# Patient Record
Sex: Male | Born: 1940 | Marital: Single | Smoking: Never smoker
Health system: Southern US, Community
[De-identification: ages and names within clinical notes are randomized; demographics above are authoritative.]

## PROBLEM LIST (undated history)

## (undated) DIAGNOSIS — H9190 Unspecified hearing loss, unspecified ear: Secondary | ICD-10-CM

## (undated) DIAGNOSIS — F0390 Unspecified dementia without behavioral disturbance: Secondary | ICD-10-CM

## (undated) DIAGNOSIS — E785 Hyperlipidemia, unspecified: Secondary | ICD-10-CM

## (undated) DIAGNOSIS — K219 Gastro-esophageal reflux disease without esophagitis: Secondary | ICD-10-CM

## (undated) DIAGNOSIS — N179 Acute kidney failure, unspecified: Secondary | ICD-10-CM

## (undated) DIAGNOSIS — E559 Vitamin D deficiency, unspecified: Secondary | ICD-10-CM

## (undated) DIAGNOSIS — I214 Non-ST elevation (NSTEMI) myocardial infarction: Secondary | ICD-10-CM

## (undated) HISTORY — DX: Unspecified hearing loss, unspecified ear: H91.90

## (undated) HISTORY — DX: Acute kidney failure, unspecified: N17.9

## (undated) HISTORY — DX: Gastro-esophageal reflux disease without esophagitis: K21.9

## (undated) HISTORY — DX: Hyperlipidemia, unspecified: E78.5

## (undated) HISTORY — DX: Unspecified dementia, unspecified severity, without behavioral disturbance, psychotic disturbance, mood disturbance, and anxiety: F03.90

## (undated) HISTORY — DX: Vitamin D deficiency, unspecified: E55.9

## (undated) HISTORY — DX: Non-ST elevation (NSTEMI) myocardial infarction: I21.4

---

## 2008-03-04 ENCOUNTER — Emergency Department: Payer: Self-pay | Admitting: Emergency Medicine

## 2010-09-22 ENCOUNTER — Observation Stay: Payer: Self-pay | Admitting: Internal Medicine

## 2010-11-12 ENCOUNTER — Emergency Department: Payer: Self-pay | Admitting: Internal Medicine

## 2011-10-15 ENCOUNTER — Inpatient Hospital Stay: Payer: Self-pay | Admitting: Internal Medicine

## 2011-10-15 LAB — CBC
HCT: 43.8 % (ref 40.0–52.0)
HGB: 14.3 g/dL (ref 13.0–18.0)
MCHC: 32.6 g/dL (ref 32.0–36.0)
MCV: 97 fL (ref 80–100)
RDW: 12.5 % (ref 11.5–14.5)
WBC: 15.2 10*3/uL — ABNORMAL HIGH (ref 3.8–10.6)

## 2011-10-15 LAB — COMPREHENSIVE METABOLIC PANEL
Albumin: 3.9 g/dL (ref 3.4–5.0)
BUN: 8 mg/dL (ref 7–18)
Bilirubin,Total: 0.3 mg/dL (ref 0.2–1.0)
Chloride: 103 mmol/L (ref 98–107)
Creatinine: 1.31 mg/dL — ABNORMAL HIGH (ref 0.60–1.30)
EGFR (African American): >60
Glucose: 139 mg/dL — ABNORMAL HIGH (ref 65–99)
SGOT(AST): 30 U/L (ref 15–37)
SGPT (ALT): 23 U/L
Total Protein: 7.2 g/dL (ref 6.4–8.2)

## 2011-10-15 LAB — URINALYSIS, COMPLETE
Leukocyte Esterase: NEGATIVE
Nitrite: NEGATIVE
Ph: 5 (ref 4.5–8.0)
Protein: NEGATIVE
RBC,UR: 1 /HPF (ref 0–5)

## 2011-10-16 LAB — BASIC METABOLIC PANEL WITH GFR
Anion Gap: 8
BUN: 11 mg/dL
Calcium, Total: 8.5 mg/dL
Chloride: 106 mmol/L
Co2: 26 mmol/L
Creatinine: 1.38 mg/dL — ABNORMAL HIGH
EGFR (African American): >60
EGFR (Non-African Amer.): 52 — ABNORMAL LOW
Glucose: 98 mg/dL
Osmolality: 279
Potassium: 4 mmol/L
Sodium: 140 mmol/L

## 2011-10-16 LAB — CBC WITH DIFFERENTIAL/PLATELET
Basophil #: 0 10*3/uL (ref 0.0–0.1)
Basophil %: 0.2 %
Eosinophil #: 0 10*3/uL (ref 0.0–0.7)
HCT: 35.3 % — ABNORMAL LOW (ref 40.0–52.0)
HGB: 12.2 g/dL — ABNORMAL LOW (ref 13.0–18.0)
Lymphocyte #: 1 10*3/uL (ref 1.0–3.6)
Lymphocyte %: 6.8 %
MCHC: 34.7 g/dL (ref 32.0–36.0)
MCV: 94 fL (ref 80–100)
Monocyte #: 1 x10 3/mm (ref 0.2–1.0)
Monocyte %: 7.2 %
Neutrophil #: 12.4 10*3/uL — ABNORMAL HIGH (ref 1.4–6.5)
RBC: 3.77 10*6/uL — ABNORMAL LOW (ref 4.40–5.90)
RDW: 12.6 % (ref 11.5–14.5)

## 2011-10-17 LAB — BASIC METABOLIC PANEL
Calcium, Total: 8.6 mg/dL (ref 8.5–10.1)
Co2: 25 mmol/L (ref 21–32)
EGFR (African American): 58 — ABNORMAL LOW
EGFR (Non-African Amer.): 50 — ABNORMAL LOW
Potassium: 3.8 mmol/L (ref 3.5–5.1)
Sodium: 142 mmol/L (ref 136–145)

## 2011-10-17 LAB — WBC: WBC: 10.1 10*3/uL (ref 3.8–10.6)

## 2012-06-06 ENCOUNTER — Inpatient Hospital Stay: Payer: Self-pay | Admitting: Internal Medicine

## 2012-06-06 LAB — URINALYSIS, COMPLETE
Glucose,UR: NEGATIVE mg/dL (ref 0–75)
Ketone: NEGATIVE
Leukocyte Esterase: NEGATIVE
Protein: 30
RBC,UR: 1 /HPF (ref 0–5)
Specific Gravity: 1.026 (ref 1.003–1.030)
Squamous Epithelial: NONE SEEN
WBC UR: <1 /HPF (ref 0–5)

## 2012-06-06 LAB — CBC
HGB: 16 g/dL (ref 13.0–18.0)
MCHC: 33.9 g/dL (ref 32.0–36.0)
MCV: 94 fL (ref 80–100)
RBC: 5.03 10*6/uL (ref 4.40–5.90)
RDW: 13 % (ref 11.5–14.5)
WBC: 21.7 10*3/uL — ABNORMAL HIGH (ref 3.8–10.6)

## 2012-06-06 LAB — TROPONIN I
Troponin-I: 0.87 ng/mL — ABNORMAL HIGH
Troponin-I: 0.76 ng/mL — ABNORMAL HIGH

## 2012-06-06 LAB — COMPREHENSIVE METABOLIC PANEL
Albumin: 2.9 g/dL — ABNORMAL LOW (ref 3.4–5.0)
Alkaline Phosphatase: 127 U/L (ref 50–136)
BUN: 35 mg/dL — ABNORMAL HIGH (ref 7–18)
Calcium, Total: 10.1 mg/dL (ref 8.5–10.1)
Chloride: 104 mmol/L (ref 98–107)
Creatinine: 1.54 mg/dL — ABNORMAL HIGH (ref 0.60–1.30)
EGFR (Non-African Amer.): 45 — ABNORMAL LOW
Glucose: 129 mg/dL — ABNORMAL HIGH (ref 65–99)
Osmolality: 285 (ref 275–301)
SGPT (ALT): 17 U/L (ref 12–78)
Sodium: 138 mmol/L (ref 136–145)
Total Protein: 7.7 g/dL (ref 6.4–8.2)

## 2012-06-06 LAB — LIPID PANEL
Cholesterol: 210 mg/dL — ABNORMAL HIGH (ref 0–200)
HDL Cholesterol: 50 mg/dL (ref 40–60)
VLDL Cholesterol, Calc: 35 mg/dL (ref 5–40)

## 2012-06-07 DIAGNOSIS — I214 Non-ST elevation (NSTEMI) myocardial infarction: Secondary | ICD-10-CM

## 2012-06-07 LAB — BASIC METABOLIC PANEL
Anion Gap: 5 — ABNORMAL LOW (ref 7–16)
BUN: 26 mg/dL — ABNORMAL HIGH (ref 7–18)
Calcium, Total: 8.3 mg/dL — ABNORMAL LOW (ref 8.5–10.1)
Co2: 26 mmol/L (ref 21–32)
Creatinine: 1.6 mg/dL — ABNORMAL HIGH (ref 0.60–1.30)
EGFR (Non-African Amer.): 43 — ABNORMAL LOW

## 2012-06-07 LAB — CBC WITH DIFFERENTIAL/PLATELET
Basophil #: 0.1 10*3/uL (ref 0.0–0.1)
Eosinophil %: 0.6 %
HCT: 37.6 % — ABNORMAL LOW (ref 40.0–52.0)
HGB: 12.3 g/dL — ABNORMAL LOW (ref 13.0–18.0)
Lymphocyte #: 1 10*3/uL (ref 1.0–3.6)
MCH: 30.9 pg (ref 26.0–34.0)
MCV: 94 fL (ref 80–100)
Monocyte #: 1 x10 3/mm (ref 0.2–1.0)
Monocyte %: 7.7 %
Neutrophil %: 83.9 %
RDW: 13.2 % (ref 11.5–14.5)

## 2012-06-07 LAB — TROPONIN I: Troponin-I: 0.61 ng/mL — ABNORMAL HIGH

## 2012-06-08 DIAGNOSIS — I359 Nonrheumatic aortic valve disorder, unspecified: Secondary | ICD-10-CM

## 2012-06-08 LAB — BASIC METABOLIC PANEL
BUN: 16 mg/dL (ref 7–18)
Creatinine: 1.2 mg/dL (ref 0.60–1.30)
EGFR (African American): >60
Glucose: 123 mg/dL — ABNORMAL HIGH (ref 65–99)
Potassium: 3.6 mmol/L (ref 3.5–5.1)
Sodium: 141 mmol/L (ref 136–145)

## 2012-06-08 LAB — CBC WITH DIFFERENTIAL/PLATELET
Basophil #: 0 10*3/uL (ref 0.0–0.1)
Basophil %: 0.7 %
Eosinophil #: 0.4 10*3/uL (ref 0.0–0.7)
Eosinophil %: 5.4 %
HGB: 12.4 g/dL — ABNORMAL LOW (ref 13.0–18.0)
Lymphocyte %: 12.3 %
MCV: 93 fL (ref 80–100)
Monocyte #: 0.8 x10 3/mm (ref 0.2–1.0)
Neutrophil %: 70.6 %
RDW: 13.1 % (ref 11.5–14.5)
WBC: 6.9 10*3/uL (ref 3.8–10.6)

## 2012-06-08 LAB — URINE CULTURE

## 2012-06-09 ENCOUNTER — Telehealth: Payer: Self-pay

## 2012-06-09 NOTE — Telephone Encounter (Signed)
TCM  

## 2012-06-09 NOTE — Telephone Encounter (Signed)
Message copied by [facility], MELISSA E on Thu Jun 09, 2012 12:31 PM ------      Message from: Thersa Salt      Created: Thu Jun 09, 2012 12:06 PM      Regarding: TCM       TCM APPT 3/14 WITH ARIDA ------

## 2012-06-09 NOTE — Telephone Encounter (Signed)
TCM #1 attempt 3/7 D/c 3/6

## 2012-06-11 LAB — CULTURE, BLOOD (SINGLE)

## 2012-06-15 ENCOUNTER — Encounter: Payer: Self-pay | Admitting: *Deleted

## 2012-06-17 ENCOUNTER — Encounter: Payer: Self-pay | Admitting: Cardiovascular Disease

## 2012-06-17 ENCOUNTER — Ambulatory Visit (INDEPENDENT_AMBULATORY_CARE_PROVIDER_SITE_OTHER): Payer: Medicare Other | Admitting: Cardiovascular Disease

## 2012-06-17 VITALS — BP 129/83 | HR 90 | Ht 65.5 in | Wt 123.2 lb

## 2012-06-17 DIAGNOSIS — I219 Acute myocardial infarction, unspecified: Secondary | ICD-10-CM

## 2012-06-17 DIAGNOSIS — I214 Non-ST elevation (NSTEMI) myocardial infarction: Secondary | ICD-10-CM

## 2012-06-17 NOTE — Patient Instructions (Addendum)
Start Aspirin 81 mg once daily.  Follow up as needed.

## 2012-06-20 DIAGNOSIS — I214 Non-ST elevation (NSTEMI) myocardial infarction: Secondary | ICD-10-CM | POA: Insufficient documentation

## 2012-06-20 NOTE — Assessment & Plan Note (Signed)
Recent small non-ST elevation myocardial infarction in the setting of pneumonia and inflammatory reaction. Echocardiogram showed normal LV systolic function. Due to his severe dementia,  I recommend medical therapy only and avoiding any ischemic for invasive cardiac evaluation.  Continue treatment with aspirin, small dose metoprolol and a statin.

## 2012-06-20 NOTE — Progress Notes (Signed)
HPI  This is a 72 year old man who is here today for a followup visit. He has known history of severe frontal lobe dementia and has been in a skilled nursing facility over the last 2 years. He presented recently to [facility] with mental status changes. He was found to be slightly hypoxic and tachycardic with a heart rate of 136 beats per minute. Chest x-ray showed right lung infiltrate. He was diagnosed with pneumonia. A He was noted to have an elevated troponin at 0.87. ECG showed minor ST depression in the inferolateral and anterior leads. The patient could not provide any history. Echocardiogram showed hyperdynamic LV systolic function, moderate left ventricular hypertrophy and mild to moderate aortic regurgitation. The patient is not able to provide any history or complaints due to  his baseline severe dementia.  Allergies  Allergen Reactions  . Sulfa Antibiotics     rash     Current Outpatient Prescriptions on File Prior to Visit  Medication Sig Dispense Refill  . ALUMINUM & MAGNESIUM HYDROXIDE PO Take by mouth.      Marland Kitchen atorvastatin (LIPITOR) 10 MG tablet Take 10 mg by mouth daily.      Marland Kitchen azithromycin (ZITHROMAX) 500 MG tablet Take 500 mg by mouth daily.      . cholecalciferol (VITAMIN D) 1000 UNITS tablet Take 1,000 Units by mouth 2 (two) times daily.      . hydrOXYzine (ATARAX/VISTARIL) 25 MG tablet Take 25 mg by mouth daily.      Marland Kitchen ketoconazole (NIZORAL) 2 % cream Apply topically as needed.      . metoprolol tartrate (LOPRESSOR) 25 MG tablet Take 12.5 mg by mouth 2 (two) times daily.      . mirtazapine (REMERON) 15 MG tablet Take 15 mg by mouth at bedtime.      . Multiple Vitamin (MULTIVITAMIN) tablet Take 1 tablet by mouth daily.      . Nutritional Supplements (ENSURE PLUS PO) Take by mouth 3 (three) times daily.      Marland Kitchen omeprazole (PRILOSEC) 40 MG capsule Take 40 mg by mouth daily.      . risperiDONE (RISPERDAL) 0.5 MG tablet Take 0.5 mg by mouth 2 (two) times daily.       No  current facility-administered medications on file prior to visit.     Past Medical History  Diagnosis Date  . Dementia     severe frontal lobe  . GERD (gastroesophageal reflux disease)   . Vitamin D deficiency disease   . Hearing loss   . Hyperlipidemia   . Acute renal failure   . Non-st elevation (nstemi) myocardial infarction      History reviewed. No pertinent past surgical history.   Family History  Problem Relation Age of Onset  . Heart disease Father      History   Social History  . Marital Status: Single    Spouse Name: N/A    Number of Children: N/A  . Years of Education: N/A   Occupational History  . Not on file.   Social History Main Topics  . Smoking status: Never Smoker   . Smokeless tobacco: Not on file  . Alcohol Use: No  . Drug Use: No  . Sexually Active: Not on file   Other Topics Concern  . Not on file   Social History Narrative  . No narrative on file      PHYSICAL EXAM   BP 129/83  Pulse 90  Ht 5' 5.5" (1.664 m)  [employer]  123 lb 4 oz (55.906 kg)  BMI 20.19 kg/m2 Constitutional: He is confused.  No distress.  HENT: No nasal discharge.  Head: Normocephalic and atraumatic.  Eyes: Pupils are equal and round. Right eye exhibits no discharge. Left eye exhibits no discharge.  Neck: Normal range of motion. Neck supple. No JVD present. No thyromegaly present.  Cardiovascular: Normal rate, regular rhythm, normal heart sounds and. Exam reveals no gallop and no friction rub. No murmur heard.  Pulmonary/Chest: Effort normal and breath sounds normal. No stridor. No respiratory distress. He has no wheezes. He has no rales. He exhibits no tenderness.  Abdominal: Soft. Bowel sounds are normal. He exhibits no distension. There is no tenderness. There is no rebound and no guarding.  Musculoskeletal: Normal range of motion. He exhibits no edema and no tenderness.  Neurological: He is alert and oriented to person, place, and time. Coordination normal.    Skin: Skin is warm and dry. No rash noted. He is not diaphoretic. No erythema. No pallor.  Psychiatric: severe baseline dementia.       EKG: Sinus  Rhythm  -Anterior infarct -age undetermined.   -  TW changes consider anterior ischemia  ABNORMAL    ASSESSMENT AND PLAN

## 2012-06-30 ENCOUNTER — Emergency Department: Payer: Self-pay | Admitting: Emergency Medicine

## 2012-06-30 LAB — CBC
HCT: 43.9 % (ref 40.0–52.0)
MCHC: 33.6 g/dL (ref 32.0–36.0)
MCV: 93 fL (ref 80–100)
Platelet: 350 10*3/uL (ref 150–440)
RBC: 4.73 10*6/uL (ref 4.40–5.90)
RDW: 13 % (ref 11.5–14.5)

## 2012-06-30 LAB — COMPREHENSIVE METABOLIC PANEL
Albumin: 3.2 g/dL — ABNORMAL LOW (ref 3.4–5.0)
Anion Gap: 4 — ABNORMAL LOW (ref 7–16)
Chloride: 112 mmol/L — ABNORMAL HIGH (ref 98–107)
Co2: 31 mmol/L (ref 21–32)
Creatinine: 1.32 mg/dL — ABNORMAL HIGH (ref 0.60–1.30)
Glucose: 115 mg/dL — ABNORMAL HIGH (ref 65–99)
Osmolality: 296 (ref 275–301)
Potassium: 4.1 mmol/L (ref 3.5–5.1)
Total Protein: 6.7 g/dL (ref 6.4–8.2)

## 2012-06-30 LAB — URINALYSIS, COMPLETE
Bacteria: NONE SEEN
Bilirubin,UR: NEGATIVE
Blood: NEGATIVE
Glucose,UR: NEGATIVE mg/dL (ref 0–75)
Leukocyte Esterase: NEGATIVE
Nitrite: NEGATIVE
Ph: 6 (ref 4.5–8.0)
Squamous Epithelial: 1

## 2012-06-30 LAB — TROPONIN I: Troponin-I: <0.02 ng/mL

## 2012-07-05 ENCOUNTER — Ambulatory Visit: Payer: Self-pay | Admitting: Internal Medicine

## 2012-07-18 ENCOUNTER — Inpatient Hospital Stay: Payer: Self-pay | Admitting: Internal Medicine

## 2012-07-18 LAB — BASIC METABOLIC PANEL
Anion Gap: 9 (ref 7–16)
Anion Gap: 5 — ABNORMAL LOW (ref 7–16)
BUN: 37 mg/dL — ABNORMAL HIGH (ref 7–18)
BUN: 41 mg/dL — ABNORMAL HIGH (ref 7–18)
BUN: 45 mg/dL — ABNORMAL HIGH (ref 7–18)
Calcium, Total: 8.4 mg/dL — ABNORMAL LOW (ref 8.5–10.1)
Chloride: 122 mmol/L — ABNORMAL HIGH (ref 98–107)
Chloride: 126 mmol/L — ABNORMAL HIGH (ref 98–107)
Co2: 23 mmol/L (ref 21–32)
Co2: 29 mmol/L (ref 21–32)
Creatinine: 1.89 mg/dL — ABNORMAL HIGH (ref 0.60–1.30)
Creatinine: 1.93 mg/dL — ABNORMAL HIGH (ref 0.60–1.30)
EGFR (African American): 40 — ABNORMAL LOW
EGFR (African American): 41 — ABNORMAL LOW
EGFR (African American): 53 — ABNORMAL LOW
EGFR (Non-African Amer.): 34 — ABNORMAL LOW
EGFR (Non-African Amer.): 35 — ABNORMAL LOW
EGFR (Non-African Amer.): 46 — ABNORMAL LOW
Glucose: 105 mg/dL — ABNORMAL HIGH (ref 65–99)
Glucose: 133 mg/dL — ABNORMAL HIGH (ref 65–99)
Osmolality: 322 (ref 275–301)
Osmolality: 323 (ref 275–301)
Potassium: 3.7 mmol/L (ref 3.5–5.1)
Potassium: 3.8 mmol/L (ref 3.5–5.1)
Potassium: 4 mmol/L (ref 3.5–5.1)
Sodium: 154 mmol/L — ABNORMAL HIGH (ref 136–145)
Sodium: 156 mmol/L — ABNORMAL HIGH (ref 136–145)

## 2012-07-18 LAB — URINALYSIS, COMPLETE
Bacteria: NONE SEEN
Bilirubin,UR: NEGATIVE
Blood: NEGATIVE
Glucose,UR: NEGATIVE mg/dL (ref 0–75)
Ketone: NEGATIVE
Leukocyte Esterase: NEGATIVE
Ph: 5 (ref 4.5–8.0)
RBC,UR: 6 /HPF (ref 0–5)
Specific Gravity: 1.033 (ref 1.003–1.030)
Squamous Epithelial: 4
WBC UR: 1 /HPF (ref 0–5)

## 2012-07-18 LAB — CBC
HCT: 49.2 % (ref 40.0–52.0)
HGB: 16.3 g/dL (ref 13.0–18.0)
MCH: 31.3 pg (ref 26.0–34.0)
RBC: 5.2 10*6/uL (ref 4.40–5.90)
RDW: 13.8 % (ref 11.5–14.5)

## 2012-07-18 LAB — TROPONIN I
Troponin-I: <0.02 ng/mL
Troponin-I: <0.02 ng/mL

## 2012-07-18 LAB — CK: CK, Total: 18 U/L — ABNORMAL LOW (ref 35–232)

## 2012-07-19 LAB — BASIC METABOLIC PANEL
Anion Gap: 3 — ABNORMAL LOW (ref 7–16)
BUN: 33 mg/dL — ABNORMAL HIGH (ref 7–18)
Calcium, Total: 8.4 mg/dL — ABNORMAL LOW (ref 8.5–10.1)
Co2: 28 mmol/L (ref 21–32)
Creatinine: 1.41 mg/dL — ABNORMAL HIGH (ref 0.60–1.30)
EGFR (African American): 58 — ABNORMAL LOW
EGFR (Non-African Amer.): 50 — ABNORMAL LOW
Glucose: 114 mg/dL — ABNORMAL HIGH (ref 65–99)
Potassium: 3.5 mmol/L (ref 3.5–5.1)
Sodium: 153 mmol/L — ABNORMAL HIGH (ref 136–145)

## 2012-07-19 LAB — CBC WITH DIFFERENTIAL/PLATELET
Basophil #: 0.1 10*3/uL (ref 0.0–0.1)
Eosinophil #: 0.2 10*3/uL (ref 0.0–0.7)
HCT: 38.7 % — ABNORMAL LOW (ref 40.0–52.0)
Lymphocyte %: 21.1 %
MCHC: 33.8 g/dL (ref 32.0–36.0)
MCV: 95 fL (ref 80–100)
Monocyte #: 0.6 x10 3/mm (ref 0.2–1.0)
Monocyte %: 7.5 %
Neutrophil #: 5.7 10*3/uL (ref 1.4–6.5)
Neutrophil %: 68.2 %
Platelet: 192 10*3/uL (ref 150–440)
WBC: 8.3 10*3/uL (ref 3.8–10.6)

## 2012-07-19 LAB — CK: CK, Total: 14 U/L — ABNORMAL LOW (ref 35–232)

## 2012-07-20 LAB — BASIC METABOLIC PANEL
Anion Gap: 7 (ref 7–16)
BUN: 16 mg/dL (ref 7–18)
Calcium, Total: 8 mg/dL — ABNORMAL LOW (ref 8.5–10.1)
Creatinine: 1.36 mg/dL — ABNORMAL HIGH (ref 0.60–1.30)
Glucose: 80 mg/dL (ref 65–99)
Osmolality: 283 (ref 275–301)
Potassium: 3.3 mmol/L — ABNORMAL LOW (ref 3.5–5.1)
Sodium: 142 mmol/L (ref 136–145)

## 2012-07-21 LAB — BASIC METABOLIC PANEL
Anion Gap: 4 — ABNORMAL LOW (ref 7–16)
BUN: 11 mg/dL (ref 7–18)
Calcium, Total: 7.8 mg/dL — ABNORMAL LOW (ref 8.5–10.1)
Chloride: 112 mmol/L — ABNORMAL HIGH (ref 98–107)
Co2: 28 mmol/L (ref 21–32)
Creatinine: 1.2 mg/dL (ref 0.60–1.30)
EGFR (African American): >60
EGFR (Non-African Amer.): >60
Glucose: 82 mg/dL (ref 65–99)
Osmolality: 285 (ref 275–301)
Potassium: 4.1 mmol/L (ref 3.5–5.1)
Sodium: 144 mmol/L (ref 136–145)

## 2012-08-04 ENCOUNTER — Ambulatory Visit: Payer: Self-pay | Admitting: Internal Medicine

## 2013-04-02 ENCOUNTER — Emergency Department: Payer: Self-pay | Admitting: Emergency Medicine

## 2013-04-02 LAB — URINALYSIS, COMPLETE
Bilirubin,UR: NEGATIVE
Blood: NEGATIVE
Ketone: NEGATIVE
Nitrite: NEGATIVE
Protein: NEGATIVE
Squamous Epithelial: NONE SEEN

## 2013-04-02 LAB — COMPREHENSIVE METABOLIC PANEL
Albumin: 3.6 g/dL (ref 3.4–5.0)
Anion Gap: 8 (ref 7–16)
BUN: 28 mg/dL — ABNORMAL HIGH (ref 7–18)
Bilirubin,Total: 0.3 mg/dL (ref 0.2–1.0)
Calcium, Total: 9.4 mg/dL (ref 8.5–10.1)
Co2: 29 mmol/L (ref 21–32)
Creatinine: 1.49 mg/dL — ABNORMAL HIGH (ref 0.60–1.30)
Potassium: 4.3 mmol/L (ref 3.5–5.1)
SGOT(AST): 9 U/L — ABNORMAL LOW (ref 15–37)
SGPT (ALT): 14 U/L (ref 12–78)
Sodium: 149 mmol/L — ABNORMAL HIGH (ref 136–145)
Total Protein: 7.1 g/dL (ref 6.4–8.2)

## 2013-04-02 LAB — CBC
HGB: 13.8 g/dL (ref 13.0–18.0)
MCH: 31.8 pg (ref 26.0–34.0)
MCHC: 33.6 g/dL (ref 32.0–36.0)
MCV: 95 fL (ref 80–100)
Platelet: 278 10*3/uL (ref 150–440)
RDW: 12.9 % (ref 11.5–14.5)
WBC: 11.1 10*3/uL — ABNORMAL HIGH (ref 3.8–10.6)

## 2013-04-02 LAB — TROPONIN I: Troponin-I: <0.02 ng/mL

## 2013-05-07 DEATH — deceased

## 2014-02-26 IMAGING — CT CT HEAD WITHOUT CONTRAST
2 series · 16 of 30 positions shown, 20 images · non-contrast
Comparison: none

REASON FOR EXAM: Fall, possible head trauma
COMMENTS:

PROCEDURE:     CT  - CT HEAD WITHOUT CONTRAST  - July 17, 2012 [DATE]
RESULT:
Comparison is made to prior study dated 06/30/2012.
TECHNIQUE: Helical noncontrasted 5 mm sections were obtained from the skull
base to the vertex.

[Series 2: without · axial · non-contrast · 0.44mm/px · z∈[-152,-32]mm · 13 of 29 slices shown, 17 images]
[im 3/29  brain]
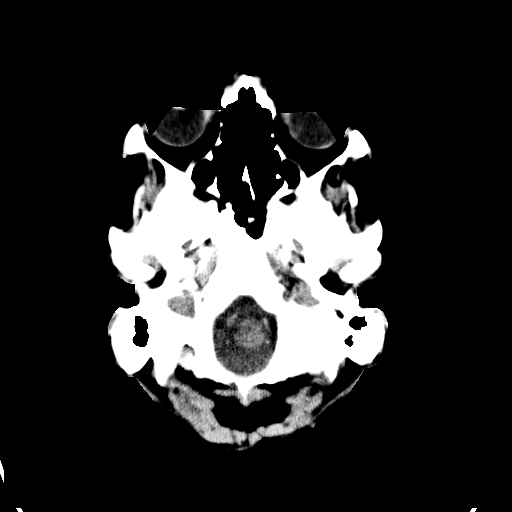
[im 3/29  bone]
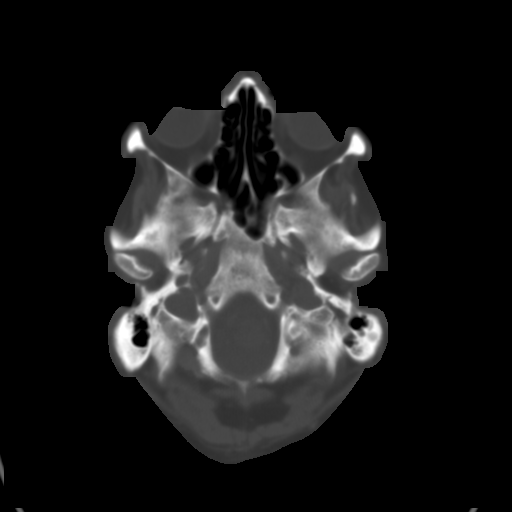
[im 5/29  brain]
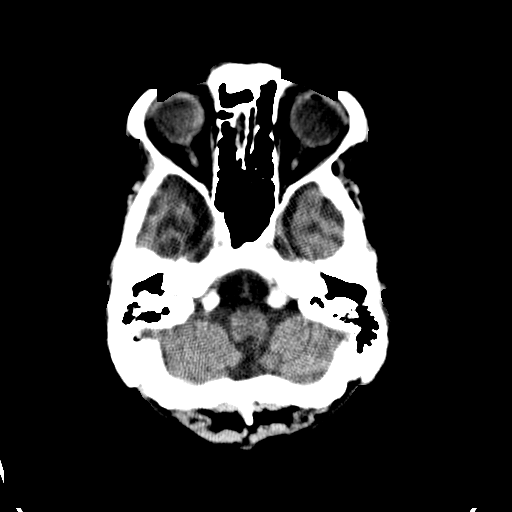
[im 7/29  brain]
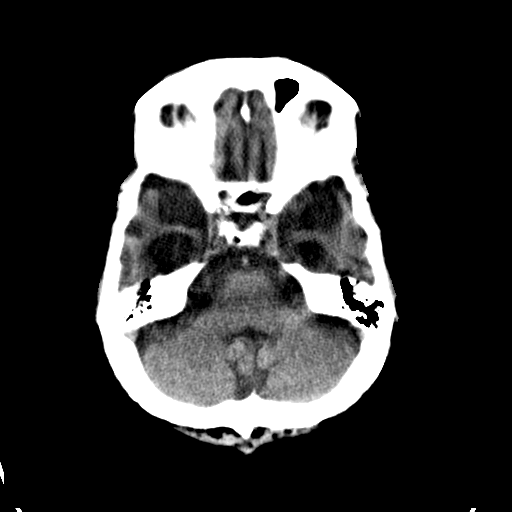
[im 9/29  brain]
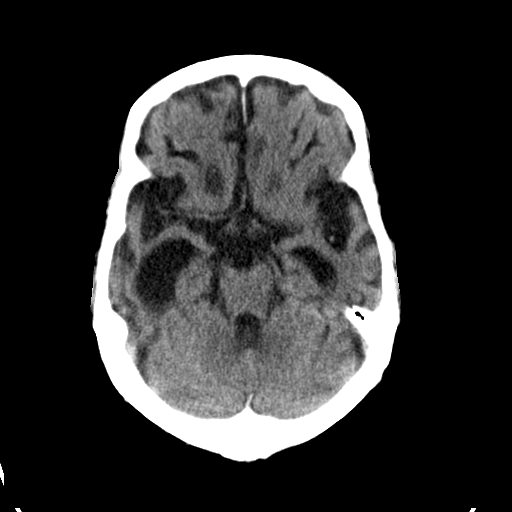
[im 11/29  brain]
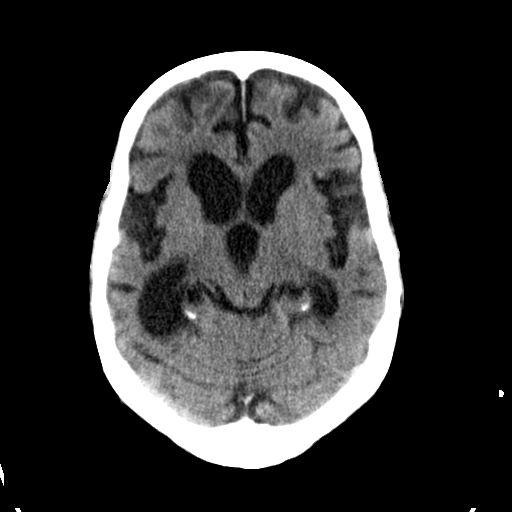
[im 11/29  bone]
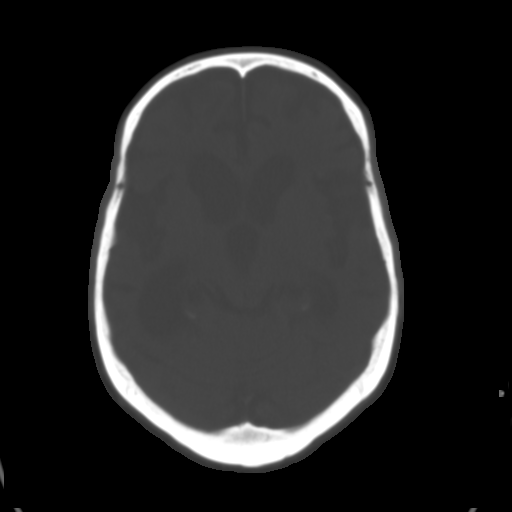
[im 13/29  brain]
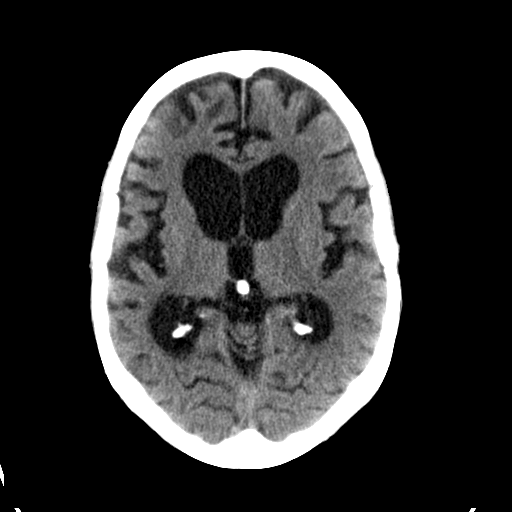
[im 15/29  brain]
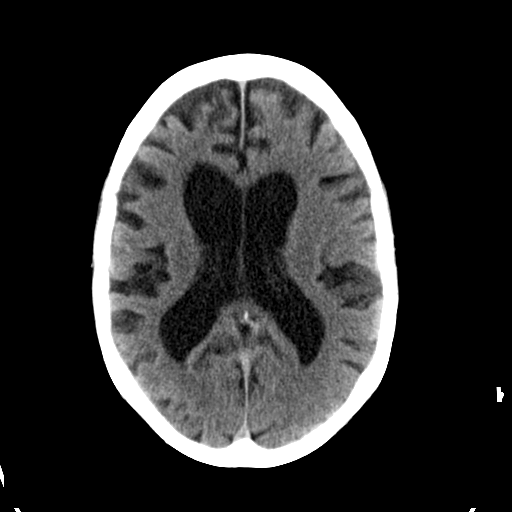
[im 17/29  brain]
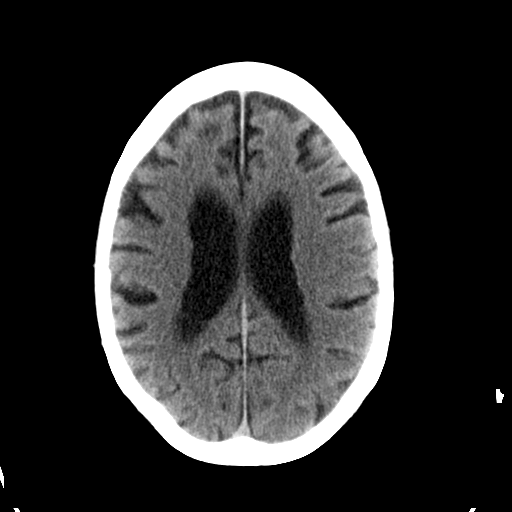
[im 19/29  brain]
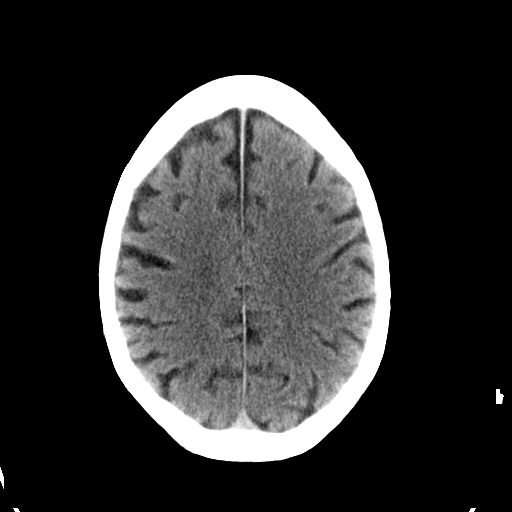
[im 19/29  bone]
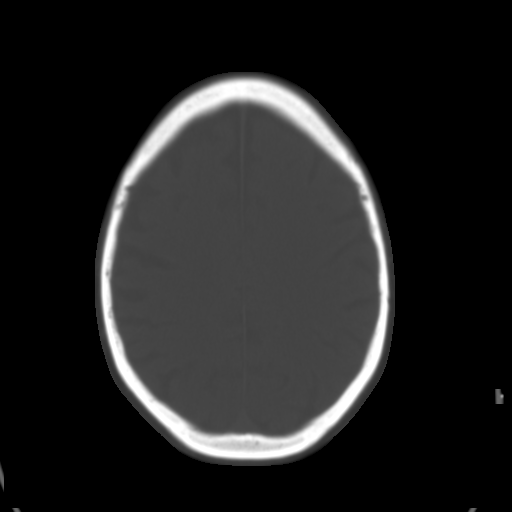
[im 21/29  brain]
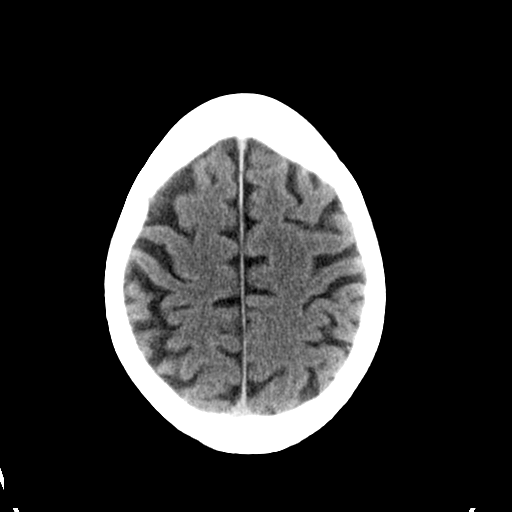
[im 23/29  brain]
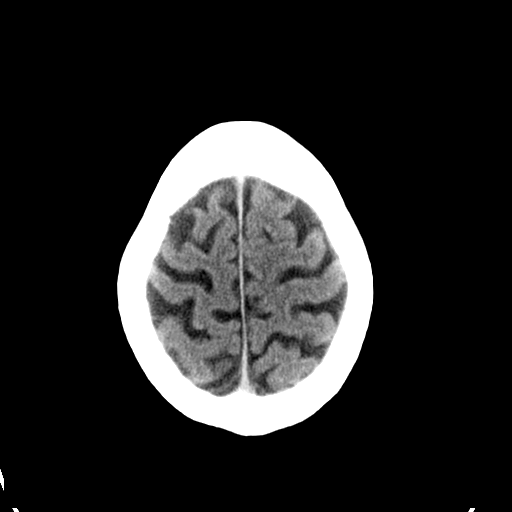
[im 25/29  brain]
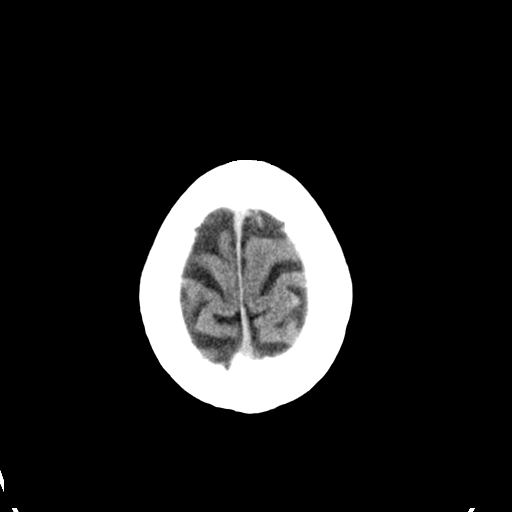
[im 27/29  brain]
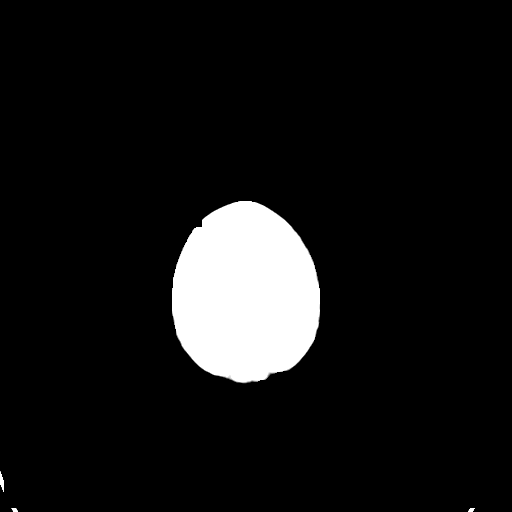
[im 27/29  bone]
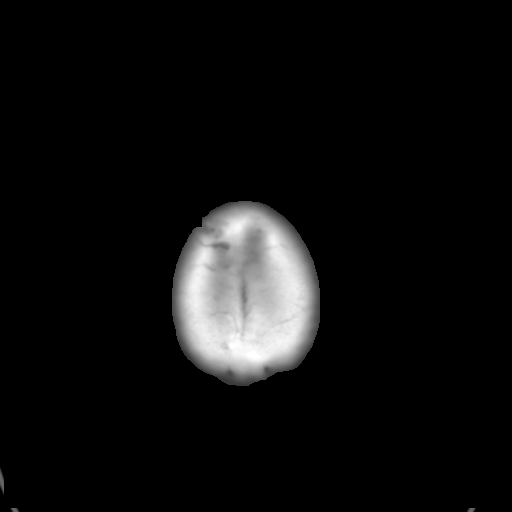

[Series 3: bone · axial · 0.44mm/px · z∈[-152,-112]mm · 3 of 29 slices shown]
[im 3/29  bone]
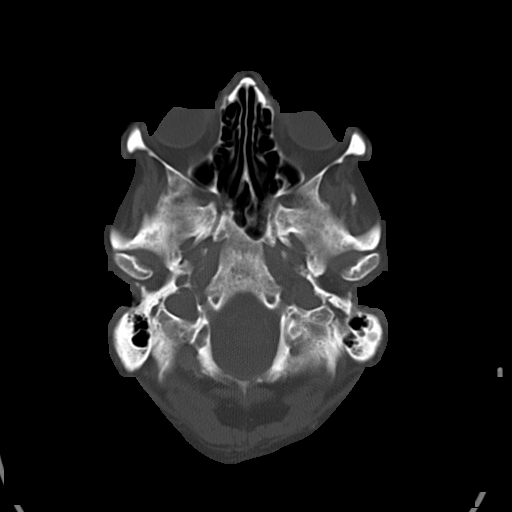
[im 7/29  bone]
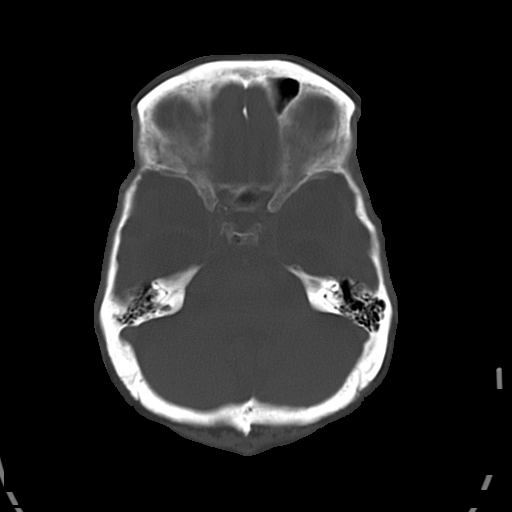
[im 11/29  bone]
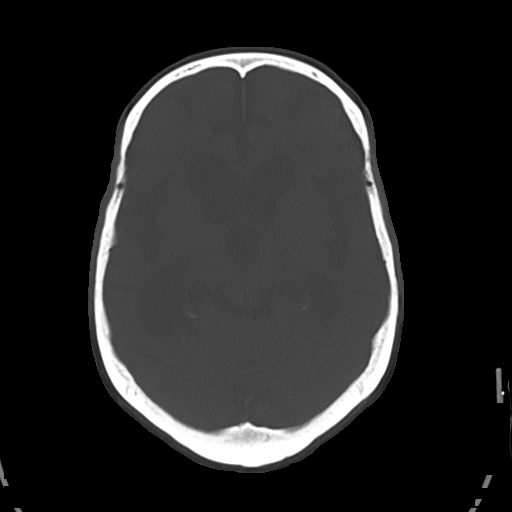

[16 of 30 positions shown; findings below may reference images not displayed]

FINDINGS: 
FINDINGS: A small area of encephalomalacia is identified within the right
frontal lobe. There is no evidence of intracranial hemorrhage, mass effect
or midline shift. There is diffuse cerebral atrophy. The ventricles are
patent. The calvarium is intact. There is no evidence of vascular
calcifications.

There is no evidence of intra-axial or extra-axial fluid collections nor
evidence of acute hemorrhage.
IMPRESSION: 1. Chronic small right frontal lobe infarct.
2. Involutional changes as described above.
3. Dr. Malow of the Emergency Department was informed of these findings via
a preliminary faxed report.

## 2014-03-01 IMAGING — CT CT PELVIS WO/W CM
1 of 2 series · 14 of 32 positions shown, 20 images · non-contrast
Comparison: none

REASON FOR EXAM: possible bladder mass vs debris
COMMENTS:

PROCEDURE:     CT  - CT PELVIS STANDARD W/WO  - July 20, 2012  [DATE]
RESULT:     History: Bladder mass versus debris.
Comparison Study: Renal ultrasound.

[Series 2: wo · axial · 0.67mm/px · z∈[-1057,-829]mm · 14 of 88 slices shown, 20 images]
[im 6/88  soft-tissue]
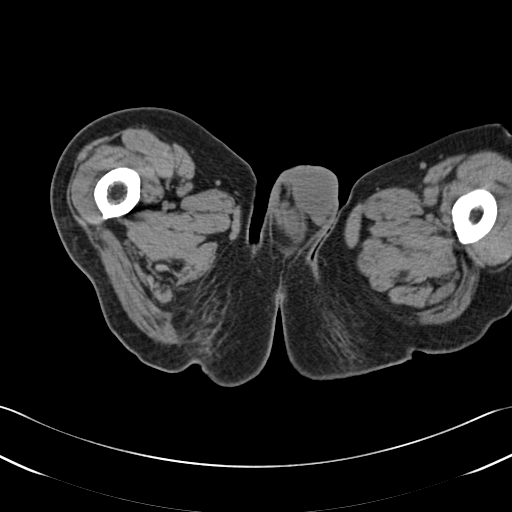
[im 6/88  bone]
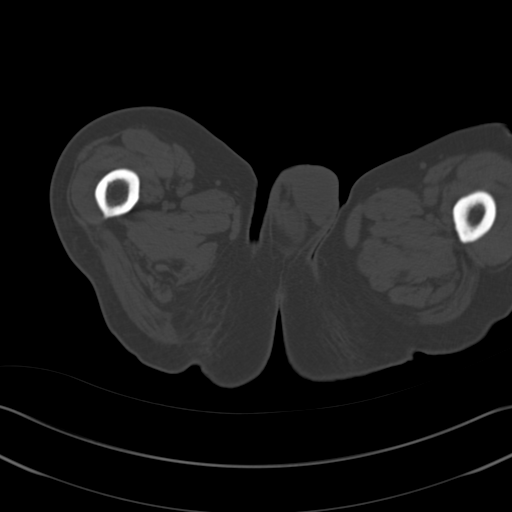
[im 12/88  soft-tissue]
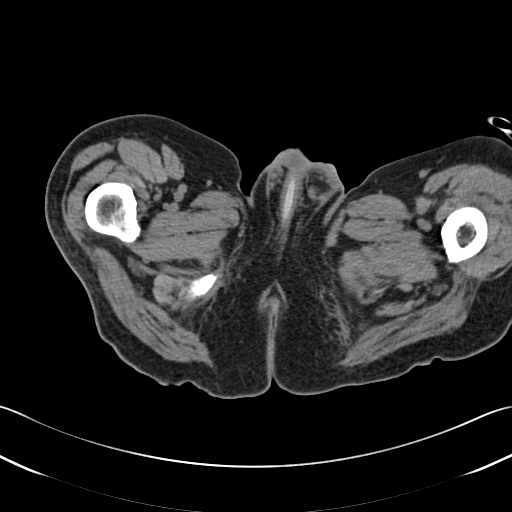
[im 18/88  soft-tissue]
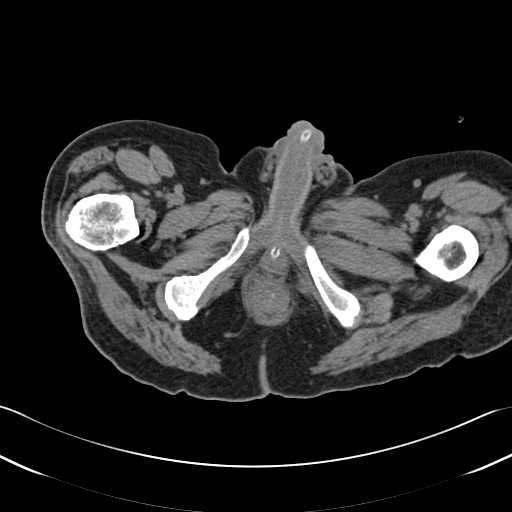
[im 24/88  soft-tissue]
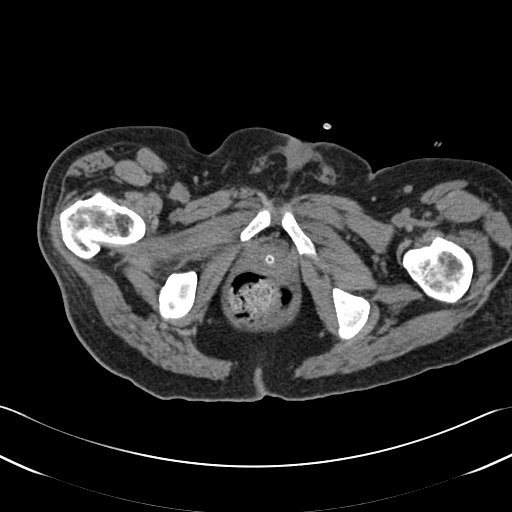
[im 30/88  soft-tissue]
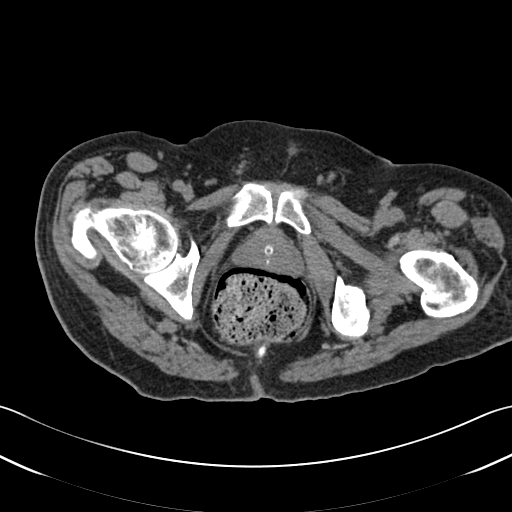
[im 35/88  soft-tissue]
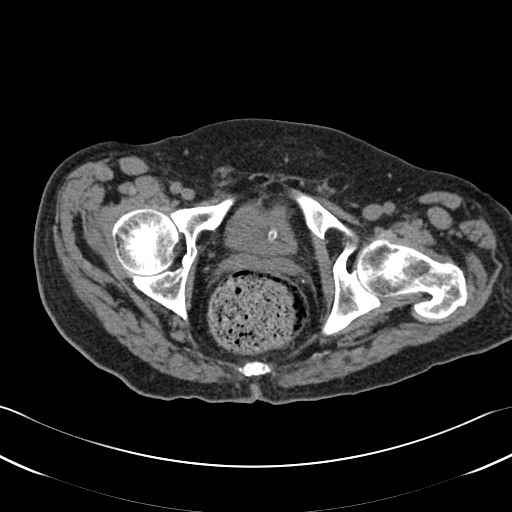
[im 41/88  soft-tissue]
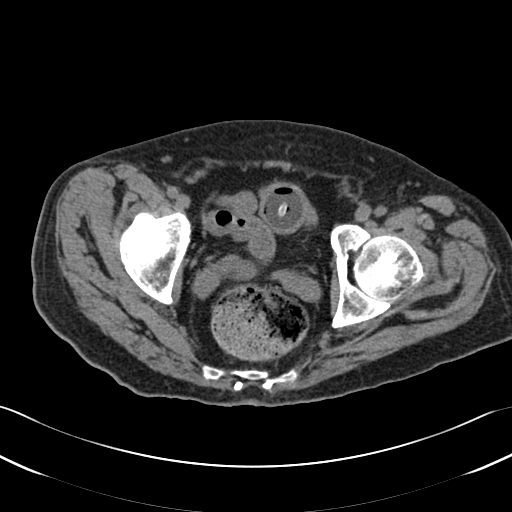
[im 47/88  soft-tissue]
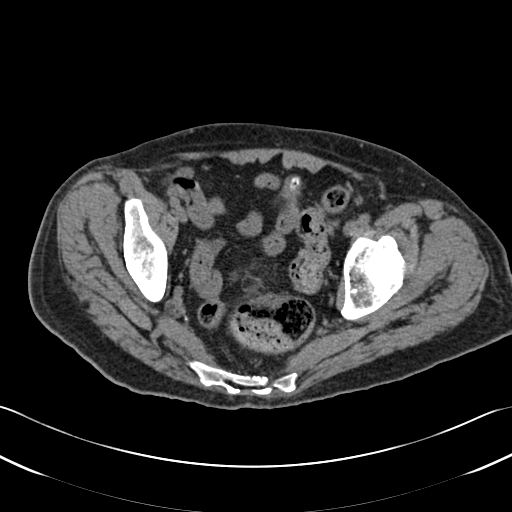
[im 53/88  soft-tissue]
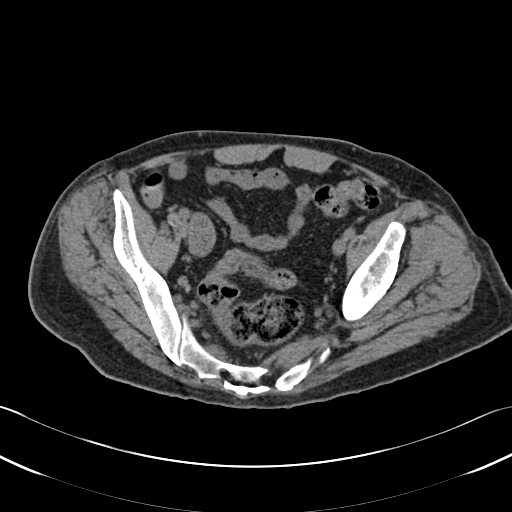
[im 53/88  bone]
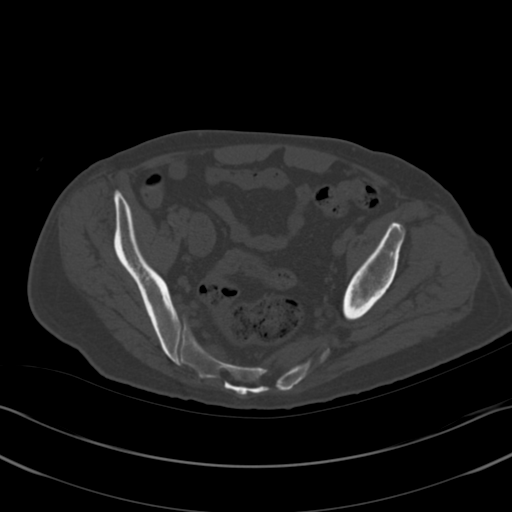
[im 59/88  soft-tissue]
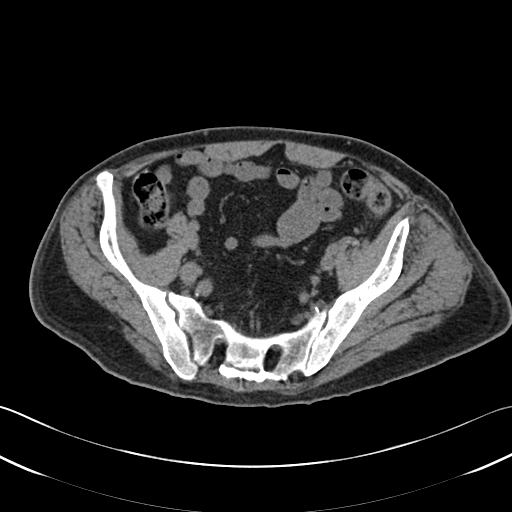
[im 64/88  soft-tissue]
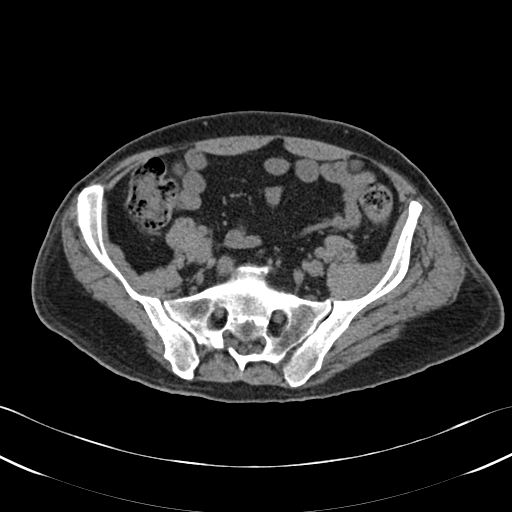
[im 64/88  lung]
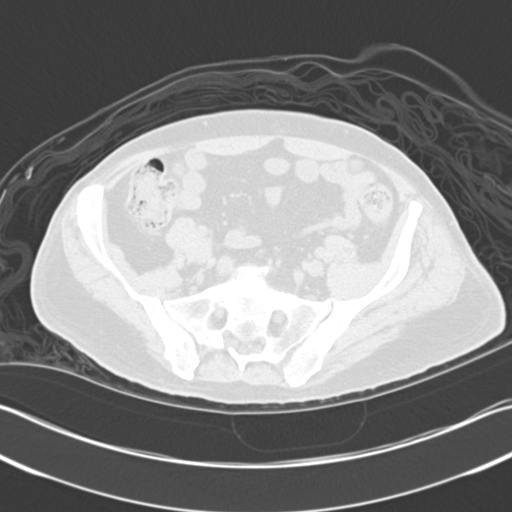
[im 70/88  soft-tissue]
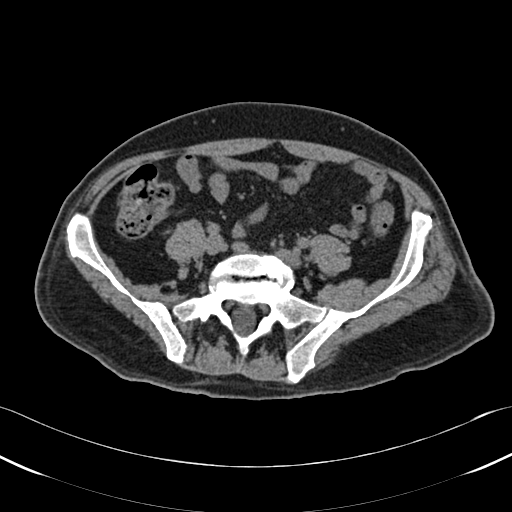
[im 70/88  lung]
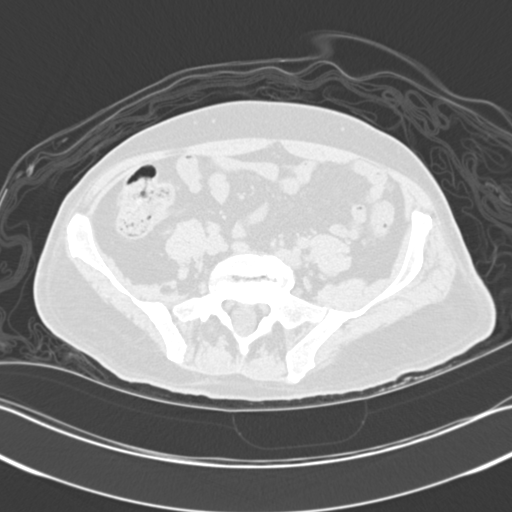
[im 76/88  soft-tissue]
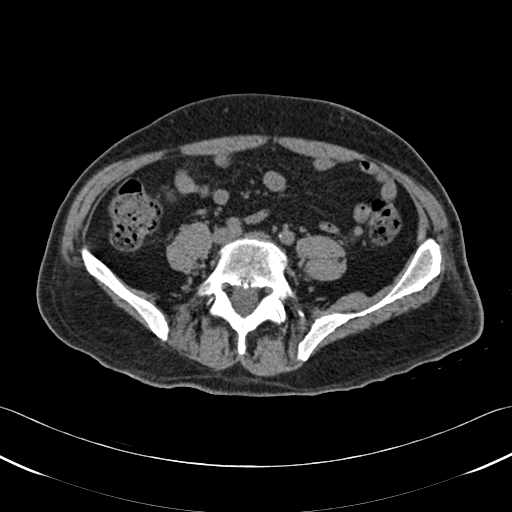
[im 76/88  lung]
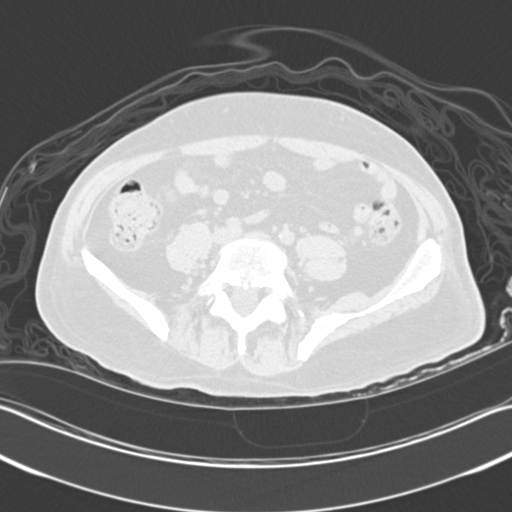
[im 82/88  soft-tissue]
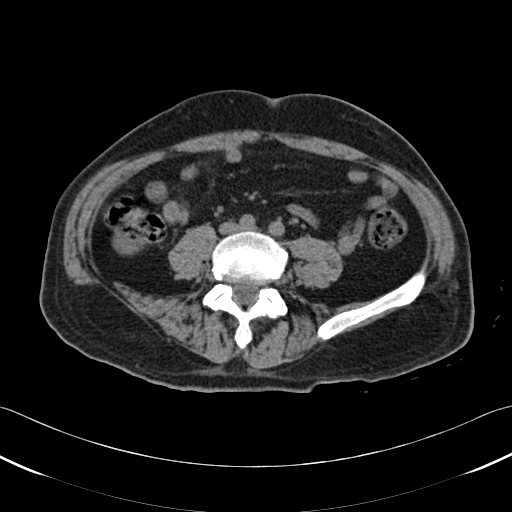
[im 82/88  lung]
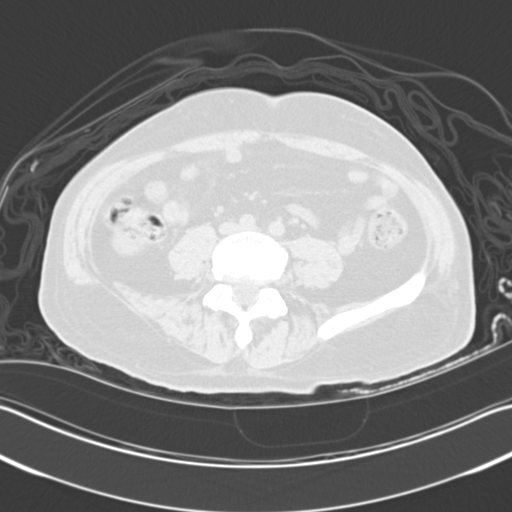

[14 of 32 positions shown; findings below may reference images not displayed]

FINDINGS: Nonenhanced CT of the pelvis was obtained this followed by a CT
cystogram. Foley catheter is present in the bladder. Mild posterior bladder
wall thickening is present. Correlation with cystoscopy is suggested to
exclude a subtle bladder mass. Large amount stool is noted in the
rectosigmoid. No adenopathy noted. No pelvic pathologic fluid collections
are noted. No focal bony abnormality. Degenerative changes lumbar spine
/both hips.
IMPRESSION: 1. Mild thickening of the posterior bladder base. Cystoscopic evaluation is
suggested to exclude a subtle subtle bladder mass.
2. Large amount stool rectosigmoid.

## 2014-07-27 NOTE — Discharge Summary (Signed)
PATIENT NAME:  Nicholas Watts, Nicholas Watts MR#:  [id number] DATE OF BIRTH:  03-Nov-1940  DATE OF ADMISSION:  06/06/2012 DATE OF DISCHARGE:  06/09/2012  DISCHARGE DIAGNOSES:  1. Non-ST elevation myocardial infarction.  2. Pneumonia.  3. Dementia.  4. Acute renal failure, resolved.  PRIMARY CARDIOLOGIST: Muhammad A. Kirke Corin, MD  HISTORY OF PRESENTING ILLNESS: A 74 year old male patient with history of frontal lobe dementia, very severe, and gastroesophageal reflux disease. He has been institutionalized for the past 2 years at an assisted living facility, mostly skilled nursing type of care. Has been doing okay overall until a few days ago. when the patient was noticed being lethargic and uncomfortable, less interactive, not able to sit down and overall looking very sick. Family went to see him and brought him to the Emergency Room. On arrival to ER, his oxygen was 92%, respiratory rate was up to 28 and heart rate was 136, temperature was 96. X-ray showed right lower lobe infiltrate. Also had elevated white cell count. Was admitted for treatment of healthcare-associated pneumonia.   HOSPITAL COURSE AND STAY:  1. He had symptoms of SIRS (systemic inflammatory response syndrome) on presentation to the ER, was secondary to pneumonia, which was treated with Levaquin, Zosyn and azithromycin. Blood culture was negative, and his pneumonia symptoms improved, and he was discharged to continue oral antibiotics at nursing home.  Other medical issues addressed during this hospital stay:  1. Borderline elevated troponin. Troponin was 0.87 on presentation, which came down to 0.76 and 0.61. Cardiology consult was called in for this, and they suggested demand ischemia and suggested to follow echocardiogram, which was followed and without any significant finding of ischemia, and the patient was discharged.  2. Acute renal failure. His creatinine on presentation was 1.6, and so he was given IV fluids. We do not have baseline, so we  thought it might be either acute or acute on chronic, and his creatinine improved later on.  3. Dementia. This was baseline.   LABORATORY RESULTS: Creatinine on presentation 1.54, which came down to 1.20. Troponin 0.87 to 0.61. Total WBC 21,700 and came down to 6900. Blood culture: No growth.  Urinary culture: No growth. Urinalysis: Negative. Global left ventricular systolic function hyperdynamic, impaired relaxation, pattern of left ventricular systolic filling, moderate asymmetrical left ventricular hypertrophy, moderately increased left ventricular septal thickness, mild to moderate aortic regurgitation.   For his chronic congestive heart failure and relaxation abnormalities, I spoke to Dr. Kirke Corin, who suggested no further workup and advised to continue cardiac medication and stop his Lovenox, which he received therapeutic dose for almost 48 hours for non-ST-elevation MI.   CONDITION ON DISCHARGE: Stable.   CODE STATUS: Full code.   MEDICATIONS ON DISCHARGE:  1. Omeprazole 40 mg once a day.  2. Risperdal 0.5 mg oral tablet 2 times a day.  3. Multivitamin once a day.  4. Vitamin D 2 times a day.  5. Atorvastatin 10 mg once a day.  6. Azithromycin 500 mg once a day for 3 days.  7. Metoprolol 25 mg take 1/2-tablet 2 times a day.   HOME HEALTH: Yes, physical therapy and nurse.   HOME OXYGEN: No.   DIET: Low fat, low cholesterol, carbohydrate-controlled diet, regular consistency.   ACTIVITY: As tolerated.   TIMEFRAME TO FOLLOWUP: Within 1 to 2 weeks with Dr. Lorine Bears.  TOTAL TIME SPENT ON THIS DISCHARGE: 45 minutes.    ____________________________ Hope Pigeon Elisabeth Pigeon, MD vgv:OSi D: 06/13/2012 23:45:13 ET T: 06/14/2012 11:10:30 ET JOB#: [id number]  cc:  Hope PigeonVaibhavkumar G. Elisabeth PigeonVachhani, MD, <Dictator> Muhammad A. Kirke CorinArida, MD Altamese DillingVAIBHAVKUMAR VACHHANI MD ELECTRONICALLY SIGNED 07/04/2012 9:25

## 2014-07-27 NOTE — Discharge Summary (Signed)
PATIENT NAME:  Nicholas Watts, Nicholas Watts MR#:  [id number] DATE OF BIRTH:  [birth year]  DATE OF ADMISSION:  07/18/2012 DATE OF DISCHARGE:  07/21/2012   ADMITTING PHYSICIAN:  Ramonita Lab, MD  DISCHARGING PHYSICIAN:  Enid Baas, MD  PRIMARY CARE PHYSICIAN: Dr. Buford Dresser.  CONSULTATIONS IN THE HOSPITAL:   1.  Nephrology consultation by Dr. Mady Haagensen.  2.  Palliative care consultation by Dr. Harriett Sine Phifer.   DISCHARGE DIAGNOSES: 1.  Hypernatremia.  2.  Hypertension secondary to hypovolemia.  3.  Syncope secondary to above.  4.  Acute renal failure.  5.  Advanced frontal lobe dementia.  6.  Failure to thrive.  7.  Bladder wall thickening noted on imaging studies, possible malignancy.  8.  Dysphagia with history of aspiration pneumonia.  DISCHARGE HOME MEDICATIONS: 1.  Omeprazole 40 mg p.o. daily.  2.  Multivitamin 1 tablet p.o. daily.  3.  Ketoconazole ointment 3% once a day as needed.  4.  Ensure Plus 3 times a day.  5.  Mirtazapine 15 mg once a day.  6.  Risperidone 0.5 mg once a day.  7.  Hydroxyzine 25 mg at bedtime as needed for sleeping.   DISCHARGE DIET:  Pureed diet with nectar thick liquids and strict aspiration precautions. Please see the discharge instructions for further recommendations on the diet.  FOLLOWUP INSTRUCTIONS: The patient will be discharged back to assisted living facility with hospice following.  LABORATORY AND DIAGNOSTIC DATA PRIOR TO DISCHARGE:  Sodium is 144, potassium 4.1, chloride 112, bicarbonate 28, BUN 11, creatinine 1.2, glucose 82 and calcium of 7.8.  CT of the pelvis was done without contrast showing mild thickening of the posterior bladder base.  Cystoscopic evaluation adjusted to exclude a bladder mass and large amount of rectosigmoid stool is seen. Ultrasound of the kidneys bilaterally showing no hydronephrosis and focal area of hyperechogenicity in the bladder showing possible bone mass present. Creatinine on admission was 1.93 with sodium  elevated at 156 on admission. Chest x-ray showing mild left basilar opacities secondary to atelectasis and CT of the head showing right frontal lobe infarct and involutional changes.  BRIEF HOSPITAL COURSE:  The patient is a 74 year old Caucasian male with advanced frontal lobe dementia who resides at [facility] ([place]). He was aphasic at baseline with only mumbling and severe dementia who was brought in secondary to a fall and syncopal episode. He was found to be hypotensive and dehydrated on admission also hypernatremic.    1.  Hypovolemic hypotension causing syncope secondary to poor p.o. intake, failure to thrive. He was given IV fluids and that improved his blood pressure.  2.  Hypernatremia, again secondary to poor intake and free water deficit. He was placed on D5 and sodium finally normalized but because of his poor intake, the problem is going to be ongoing and recurrent so palliative care was consulted and the patient had some family.  He says he has  2 ex-wives but son, Nicholas Watts, is the healthcare power of attorney and in discussing with the family, they do not want any PEG tube.  Speech pathologist recommended a puree diet with nectar thick liquids with strict aspiration precautions at this time. Bladder mass was found incidentally on the CT scan, but no further recommendation pursued.  The patient qualified for hospice secondary to advanced dementia, so he will be discharged back to [facility] with hospice following as per the family's wishes.  His medications were minimized and he is also on omeprazole for his reflux  disease. His renal failure also improved by the time of discharge.   CODE STATUS: DO NOT RESUSCITATE.   DISCHARGE CONDITION: Guarded with poor prognosis.   DISCHARGE DISPOSITION: Assisted living facility with hospice following.   Time spent on discharge: 45 minutes.    ____________________________ Enid Baasadhika Aricka Goldberger, MD rk:ct D: 07/21/2012 15:32:20  ET T: 07/21/2012 16:32:29 ET JOB#: [id number]  cc: Enid Baasadhika Danyal Adorno, MD, <Dictator> Enid BaasADHIKA Remee Charley MD ELECTRONICALLY SIGNED 07/22/2012 15:54

## 2014-07-27 NOTE — Consult Note (Signed)
General Aspect 74 yo male with PMHx significant for frontal lobe dementia and GERD who was hospitalized yesterday for SIRS sourced from [facility].   Present Illness He resides at [facility]. History was supplemented by the family. The family visited the patient on Saturday and he was noted to be in his [place]. The following day, he was apparently more lethargic and generally did not look well prompting his ED presentation. Denies chest pain, SOB, PND, LE edema, orthopnea, syncope or palpitations. No prior cardiac history. Never has seen a cardiologist- no stress testing, cath or cardiac surgery in the past. He does have hyperlipidemia. No history of HTN, DM2, tobacco abuse or family history of CAD. He does walk at the facility quite frequently, and has had no limitation with this.   There, CXR revealed a RLL infiltrate and he met SIRS criteria. He was diagnosed with HCAP, started on empiric BS antibiotics and admitted by the medicine service. Cardiac markers did return mildly elevated and downtrended (TnI 0.87=>0.76=>0.61). CBC indicated a leukocytosis at 21.7. H/H stable. BMET- BUN 35/Cr 1.54. Lipid panel indicates LDL 125, TC 210, HDL 50, TG 150. U/a unremarkable. Today, CBC reveals reduced H/H 12.3/37.6. Leukocytosis improved to ~ 13. BUN 26/Cr 1.60.  EKG reveals sinus tachycardia with anterior and inferolateral downsloping/flat ST depressions with associated TWIs (1 mm). He did have a CT for incoordination exhibited on admission. This revealed increased ventriculomegaly c/w cerebral atrophy, normal pressure hydrocephalus could not be excluded.     Dysphagia: baseline at NH per family report   Dementia:        Admit Diagnosis:   HOSPITAL ACQUIRED PNEUMONIA: Onset Date: 07-Jun-2012, Status: Active, Description: HOSPITAL ACQUIRED PNEUMONIA    Sodium Chloride 0.9%, 1000 ml at 999 ml/hr, Stop After: 1 Doses, 06-Jun-2012, Completed, Standard   Sodium Chloride 0.9%, 1000 ml at 999 ml/hr, Stop After: 1  Doses, 06-Jun-2012, Completed, Standard   Sodium Chloride 0.9%, 1000 ml at 75 ml/hr, 06-Jun-2012, Active, Standard   Vancomycin  injection, ( Vancocin injection )  1000 mg in Dextrose 5% 250 ml, IV Piggyback, once, Infuse over 60 minute(s)  Indication: Infection, 06-Jun-2012, Completed, Standard   Aspirin Chewable, 324 mg Oral once  - Indication: Pain/Fever/Thromboembolic Disorders/Post MI/Prophylaxis MI, 06-Jun-2012, Completed, Standard   Piperacillin-Tazobactam injection,  ( Zosyn )  3.375 gram, IV Piggyback, once, Infuse over 30 minute(s)  Indication: Infection, 06-Jun-2012, Completed, Standard   Acetaminophen * tablet, ( Tylenol (325 mg) tablet)  650 mg Oral q4h PRN for pain or temp. greater than 100.4  - Indication: Pain/Fever, 06-Jun-2012, Active, Standard   HePARin injection, 5000 unit(s), Subcutaneous, q12h  Indication: Anticoagulant, Monitor Anticoags per hospital protocol, 06-Jun-2012, Discontinued, Standard   Levofloxacin injection, ( Levaquin injection )  750 mg, IV Piggyback, q24h, Infuse over 90 minute(s)  Indication: Infection, -Then pharmacy to dose, 06-Jun-2012, Discontinued, Standard   Ondansetron injection, ( Zofran injection )  4 mg, IV push, q4h PRN for Nausea/Vomiting  Indication: Nausea/ Vomiting, 06-Jun-2012, Active, Standard   Pantoprazole tablet, 40 mg Oral q6am  - Indication: Erosive Esophagitis/ GERD  Instructions:  DO NOT CRUSH, 06-Jun-2012, Active, Standard   Piperacillin-Tazobactam injection, ( Zosyn )  3.375 gram, IV Piggyback, q8h, Infuse over 4 hour(s)  Indication: Infection  Pharmacy dose per Zosyn Ext Inf Protocol, -Then pharmacy to dose, 06-Jun-2012, Discontinued, Standard   Vancomycin  injection, ( Vancocin injection )  1000 mg in Dextrose 5% 250 ml, IV Piggyback, q24h, Infuse over 60 minute(s)  Indication: Infection, -Then pharmacy  to dose, 06-Jun-2012, Discontinued, Standard   Enoxaparin injection,  ( Lovenox injection )  65 mg,  Subcutaneous, q12h  Indication: Prophylaxis or treatment of thromboembolic disorders, Monitor Anticoags per hospital protocol, 06-Jun-2012, Active, Standard   Aspirin Tri-Buffered, 325 mg Oral daily  - Indication: Pain/Fever/Thromboembolic Disorders/Post MI/Prophylaxis MI, 06-Jun-2012, Active, Standard   atorvaSTATin tablet,  ( Lipitor)  10 mg Oral at bedtime  - Indication: Hypercholesterolemia, 06-Jun-2012, Active, Standard   meTOProlol injection, ( Lopressor injection )  2.5 mg, IV push, q6h  Indication: Antihypertensive/ Angina, hold for sbp < 100 hr <50, 06-Jun-2012, Active, Standard   Nitroglycerin 2% ointment, 0.5 inch(es) Topical bid  -Indication:Angina/ Hypertension  Instructions:  Apply daily at 6 am, 12 noon and remove at 6 pm to begin 12 hour nitrate free period., 06-Jun-2012, Active, Standard   Nitroglycerin Ointment/Patch - REMOVAL, 6pm, 06-Jun-2012, Active, Standard   Cholecalciferol  tablet, ( Vitamin D3)  1000 unit(s) Oral daily  - Indication: Vit D deficiency, 06-Jun-2012, Active, Standard   hydrOXYzine tablet, ( Atarax)  25 mg Oral at bedtime  - Indication: Anxiety/ Puritis/ Nausea/ Withdrawal Symptoms, 06-Jun-2012, Active, Standard   Mirtazapine tablet, ( Remeron)  15 mg Oral at bedtime  - Indication: Depression, 06-Jun-2012, Active, Standard   Multivitamin tablet, ( Vitamin - Multiple)  1 tablet(s) Oral daily  - Indication: Prevention and Treatment of Vitamin Deficiencies, 06-Jun-2012, Active, Standard   risperiDONE tablet, ( RisperDAL)  0.5 mg Oral bid  - Indication: Psychotic Disorders/ Dementia, 06-Jun-2012, Active, Standard   Azithromycin injection, 500 mg in Dextrose 5% 250 ml, IV Piggyback, q24h, Infuse over 60 minute(s)  Indication: Infection, 06-Jun-2012, Active, Standard   Influenza Virus Trivalent Vaccine injection, 0.5 ml, Intramuscular, atdischarge  Indication: Provide Active Immunity to Influenza Strains contained in Vaccine, GIVE ON  DISCHARGE DAY., 06-Jun-2012, Active, Standard   Piperacillin-Tazobactam injection,  ( Zosyn )  3.375 gram, IV Piggyback, q8h, Infuse over 4 hour(s)  Indication: Infection  Pharmacy dose per Zosyn Ext Inf Protocol, <<<Extended infusion protocol>>>, 06-Jun-2012, Active, Standard   Vancomycin  injection, ( Vancocin injection )  750 mg in Dextrose 5% 250 ml, IV Piggyback, q18h, Infuse over 60 minute(s)  Indication: Infection, 07-Jun-2012, Active, Standard   Levofloxacin injection, ( Levaquin injection )  750 mg, IV Piggyback, q48h, Infuse over 90 minute(s)  Indication: Infection, 08-Jun-2012, Pending, Standard   Albuterol/Ipratropium SVN, Initiate Respiratory Therapy Protocol  3 ml SVN q4h while awake  CP will dilute in standard solution (NS 71m), 06-Jun-2012, Active, Standard   Oxygen, PRN  Titrate Adj SAT(%):92%  CP please wean O2 to 92% per oxygen weaning protocol  Initiate Respiratory Therapy Protocol, 06-Jun-2012, Active, Standard  Home Medications: Medication Instructions Status  Vitamin D3 1000 intl units oral tablet 1 tab(s) orally 2 times a day Active  mirtazapine 15 mg oral tablet 1 tab(s) orally once a day (at bedtime) Active  ketoconazole topical 2% topical cream Apply topically to affected area once a day, As Needed Active  omeprazole 40 mg oral delayed release capsule 1 cap(s) orally once a day Active  risperidone 0.5 mg oral tablet 1 tab(s) orally 2 times a day Active  multivitamin 1 tab(s) orally once a day Active  hydrOXYzine hydrochloride 25 mg oral tablet 1 tab(s) orally once a day (at bedtime) Active   Lab Results:  Hepatic:  03-Mar-14 13:38   Bilirubin, Total 0.4  Alkaline Phosphatase 127  SGPT (ALT) 17  SGOT (AST)  45  Total Protein, Serum 7.7  Albumin, Serum  2.9  Routine Micro:  03-Mar-14 13:38   Micro Text Report BLOOD CULTURE   COMMENT                   NO GROWTH IN 18-24 HOURS   ANTIBIOTIC                       Micro Text Report BLOOD CULTURE    COMMENT                   NO GROWTH IN 18-24 HOURS   ANTIBIOTIC                       Specimen Source right arm  Specimen Source left arm  Culture Comment NO GROWTH IN 18-24 HOURS  Result(s) reported on 07 Jun 2012 at 07:21AM.  Culture Comment NO GROWTH IN 18-24 HOURS  Result(s) reported on 07 Jun 2012 at 07:21AM.  Routine Chem:  03-Mar-14 13:38   Result Comment TROPONIN - RESULTS VERIFIED BY REPEAT TESTING.  - CALLED RESULT TO LINDA MCLAMB AT 1424  - 06/06/12-DAC  - READ-BACK PROCESS PERFORMED.  Result(s) reported on 06 Jun 2012 at 02:30PM.  Glucose, Serum  129  BUN  35  Creatinine (comp)  1.54  Sodium, Serum 138  Potassium, Serum 4.3  Chloride, Serum 104  CO2, Serum 24  Calcium (Total), Serum 10.1  Anion Gap 10  Osmolality (calc) 285  eGFR (African American)  52  eGFR (Non-African American)  45 (eGFR values <60m/min/1.73 m2 may be an indication of chronic kidney disease (CKD). Calculated eGFR is useful in patients with stable renal function. The eGFR calculation will not be reliable in acutely ill patients when serum creatinine is changing rapidly. It is not useful in  patients on dialysis. The eGFR calculation may not be applicable to patients at the low and high extremes of body sizes, pregnant women, and vegetarians.)  Cholesterol, Serum  210  Triglycerides, Serum 175  HDL (INHOUSE) 50  VLDL Cholesterol Calculated 35  LDL Cholesterol Calculated  125 (Result(s) reported on 06 Jun 2012 at 07:45PM.)    21:40   Result Comment troponin - RESULTS VERIFIED BY REPEAT TESTING.  - previously called _0 :24 06-06-12 by DFrancisco Result(s) reported on 06 Jun 2012 at 10:29PM.  04-Mar-14 05:44   Result Comment TROPONIN - RESULTS VERIFIED BY REPEAT TESTING.  - PREV. C/ 06-06-12 _1  BY DAC..Marland KitchenJO  Result(s) reported on 07 Jun 2012 at 06:58AM.  Glucose, Serum 87  BUN  26  Creatinine (comp)  1.60  Sodium, Serum 140  Potassium, Serum 4.0  Chloride, Serum  109  CO2, Serum 26   Calcium (Total), Serum  8.3  Anion Gap  5  Osmolality (calc) 284  eGFR (African American)  50  eGFR (Non-African American)  43 (eGFR values <622mmin/1.73 m2 may be an indication of chronic kidney disease (CKD). Calculated eGFR is useful in patients with stable renal function. The eGFR calculation will not be reliable in acutely ill patients when serum creatinine is changing rapidly. It is not useful in  patients on dialysis. The eGFR calculation may not be applicable to patients at the low and high extremes of body sizes, pregnant women, and vegetarians.)  Magnesium, Serum 1.8 (1.8-2.4 THERAPEUTIC RANGE: 4-7 mg/dL TOXIC: > 10 mg/dL  -----------------------)  Cardiac:  03-Mar-14 13:38   Troponin I  0.87 (0.00-0.05 0.05 ng/mL or less: NEGATIVE  Repeat testing in 3-6 hrs  if clinically  indicated. >0.05 ng/mL: POTENTIAL  MYOCARDIAL INJURY. Repeat  testing in 3-6 hrs if  clinically indicated. NOTE: An increase or decrease  of 30% or more on serial  testing suggests a  clinically important change)  CK, Total  741  CPK-MB, Serum  4.9 (Result(s) reported on 06 Jun 2012 at 06:09PM.)    21:40   Troponin I  0.76 (0.00-0.05 0.05 ng/mL or less: NEGATIVE  Repeat testing in 3-6 hrs  if clinically indicated. >0.05 ng/mL: POTENTIAL  MYOCARDIAL INJURY. Repeat  testing in 3-6 hrs if  clinically indicated. NOTE: An increase or decrease  of 30% or more on serial  testing suggests a  clinically important change)  04-Mar-14 05:44   Troponin I  0.61 (0.00-0.05 0.05 ng/mL or less: NEGATIVE  Repeat testing in 3-6 hrs  if clinically indicated. >0.05 ng/mL: POTENTIAL  MYOCARDIAL INJURY. Repeat  testing in 3-6 hrs if  clinically indicated. NOTE: An increase or decrease  of 30% or more on serial  testing suggests a  clinically important change)  Routine UA:  03-Mar-14 14:38   Color (UA) Yellow  Clarity (UA) Turbid  Glucose (UA) Negative  Bilirubin (UA) Negative  Ketones (UA)  Negative  Specific Gravity (UA) 1.026  Blood (UA) Negative  pH (UA) 5.0  Protein (UA) 30 mg/dL  Nitrite (UA) Negative  Leukocyte Esterase (UA) Negative (Result(s) reported on 06 Jun 2012 at 04:52PM.)  RBC (UA) 1 /HPF  WBC (UA) <1 /HPF  Bacteria (UA) NONE SEEN  Epithelial Cells (UA) NONE SEEN  Mucous (UA) PRESENT  Calcium Oxalate Crystal (UA) PRESENT (Result(s) reported on 06 Jun 2012 at 04:52PM.)  Routine Hem:  03-Mar-14 13:38   WBC (CBC)  21.7  RBC (CBC) 5.03  Hemoglobin (CBC) 16.0  Hematocrit (CBC) 47.1  Platelet Count (CBC) 262 (Result(s) reported on 06 Jun 2012 at 01:56PM.)  MCV 94  MCH 31.7  MCHC 33.9  RDW 13.0  04-Mar-14 05:44   WBC (CBC)  13.3  RBC (CBC)  3.99  Hemoglobin (CBC)  12.3  Hematocrit (CBC)  37.6  Platelet Count (CBC) 231  MCV 94  MCH 30.9  MCHC 32.8  RDW 13.2  Neutrophil % 83.9  Lymphocyte % 7.3  Monocyte % 7.7  Eosinophil % 0.6  Basophil % 0.5  Neutrophil #  11.1  Lymphocyte # 1.0  Monocyte # 1.0  Eosinophil # 0.1  Basophil # 0.1 (Result(s) reported on 07 Jun 2012 at 06:33AM.)   EKG:  Interpretation sinus tachycardia, 119 bpm, incomplete RBBB, LAE, ST depression V3-V6, I, II, III, aVF   Radiology Results: XRay:    03-Mar-14 14:01, Chest PA and Lateral  Chest PA and Lateral   REASON FOR EXAM:    WEAKNESS  COMMENTS:       PROCEDURE: DXR - DXR CHEST PA (OR AP) AND LATERAL  - Jun 06 2012  2:01PM     RESULT: Comparison is made to the previous examination of 17 October 2011.    Is a band of density in the right lung base which isunchanged. Fibrosis   or chronic atelectasis or most likely differential considerations.   Infiltrate is felt to be somewhat less likely but not excluded. The heart   size appears normal. There is no edema or pneumothorax.    IMPRESSION:   1. Band ofatelectasis/fibrosis or possibly infiltrate in the right lung   base. Followup to document clearing is recommended.  Dictation Site: 2        Verified By: Cay Schillings  H. Vicie Mutters, M.D., MD  CT:    03-Mar-14 13:58, CT Head Without Contrast  CT Head Without Contrast   REASON FOR EXAM:    LEANING TO THE RIGHT, ? DECREASED MENTAL STATUS, HX   OF DEMENTIA AT BASELINE  COMMENTS:       PROCEDURE: CT  - CT HEAD WITHOUT CONTRAST  - Jun 06 2012  1:58PM     RESULT: Comparison:  None    Technique: Multiple axial images fromthe foramen magnum to the vertex   were obtained without IV contrast.    Findings:      There is no evidence of mass effect, midline shift, or extra-axial fluid   collections.  There is no evidence of a space-occupying lesion or     intracranial hemorrhage. There is no evidence of a cortical-based area of   acute infarction. There is generalized cerebral atrophy. There is   periventricular white matter low attenuation likely secondary to   microangiopathy.    There is ventriculomegaly which is more prominent compared with the prior   examination of 09/22/2010 with a bifrontal diameter of 5.2 cm. The basal   cisterns are patent.    Visualized portions of the orbits are unremarkable. The visualized   portions of the paranasal sinuses and mastoidair cells are unremarkable.     The osseous structures are unremarkable.    IMPRESSION:    No acute intracranial process.    Ventriculomegaly which has increased compared with 09/22/2010 which may be   secondary to cerebral cortical atrophy, but normal pressure hydrocephalus   cannot be excluded. Correlate with clinical exam.    Dictation Site: 1        Verified By: Jennette Banker, M.D., MD    Sulfa drugs: Unknown  Vital Signs/Nurse's Notes: **Vital Signs.:   04-Mar-14 00:32  Vital Signs Type Routine  Temperature Temperature (F) 99.3  Celsius 37.3  Pulse Pulse 104  Respirations Respirations 24  Systolic BP Systolic BP 440  Diastolic BP (mmHg) Diastolic BP (mmHg) 77  Mean BP 90  Pulse Ox % Pulse Ox % 94  Pulse Ox Activity Level  At rest  Oxygen Delivery Room Air/ 21 %    04:11   Vital Signs Type Routine  Temperature Temperature (F) 99  Celsius 37.2  Temperature Source axillary  Pulse Pulse 107  Respirations Respirations 20  Systolic BP Systolic BP 347  Diastolic BP (mmHg) Diastolic BP (mmHg) 64  Mean BP 76  Pulse Ox % Pulse Ox % 94  Pulse Ox Activity Level  At rest  Oxygen Delivery Room Air/ 21 %    09:33  Vital Signs Type Routine  Temperature Temperature (F) 97.9  Celsius 36.6  Temperature Source axillary  Pulse Pulse 87  Respirations Respirations 18  Systolic BP Systolic BP 425  Diastolic BP (mmHg) Diastolic BP (mmHg) 74  Mean BP 95  Pulse Ox % Pulse Ox % 95  Pulse Ox Activity Level  At rest  Oxygen Delivery Room Air/ 21 %    Impression 74 yo male with PMHx significant for frontal lobe dementia and GERD who was hospitalized yesterday for SIRS sourced from HCAP. Cardiac biomarkers returned elevated, EKG shows ST depressions ruling him in for NSTEMI.   Plan 1. SIRS 2. HCAP 3. NSTEMI 4. Prerenal azotemia 5. Normocytic anemia 6. Dementia 7. GERD 8. Hyperlipidemia  The patient has no prior cardiac history and minimal risk factors- age, gender and HLD. Per his family, he has not complained of  chest pain, dyspnea on exertion or shortness of breath. Objectively, cardiac biomarkers and EKG do indicate underlying ischemia, however this may be demand-driven in the setting of SIRS from HCAP. Family seems apprehensive in pursuing cardiac work-up- noninvasive or otherwise. He does seem to have a small degree of JVD on exam, but no peripheral edema or adventitious lung sounds. Would suggest pursuing a 2D echo to evaluate overall cardiac function. If reduced EF or WMA apparent, discussed need to pursue ischemic eval and family understands. Add BB to slow rate and reduce demand. LDL is elevated this admission, add statin.   Electronic Signatures for Addendum Section:  Kathlyn Sacramento (MD) (Signed Addendum 04-Mar-14 18:18)  I suspect that this is type 2 NSTEMI  due to supply demand ischemia. Treat underlying condition. Will use Metoprolol for tachycardia. Can stop full dose Lovenox. Check an echo. Given his significant dementia, an overall conservaitve management is recommended. Ischemic evaluation is not recommended at this time.   Electronic Signatures: Meriel Pica (PA-C)  (Signed 04-Mar-14 14:15)  Authored: General Aspect/Present Illness, Past Medical History, Health Issues, Orders, Home Medications, Labs, EKG , Radiology, Allergies, Vital Signs/Nurse's Notes, Impression/Plan Kathlyn Sacramento (MD)  (Signed 04-Mar-14 18:18)  Co-Signer: Home Medications, Allergies   Last Updated: 04-Mar-14 18:18 by Kathlyn Sacramento (MD)

## 2014-07-27 NOTE — H&P (Signed)
PATIENT NAME:  Nicholas Watts, Nicholas Watts MR#:  [id number] DATE OF BIRTH:  Feb 18, 1941  DATE OF ADMISSION:  07/17/2012  PRIMARY CARE PHYSICIAN: [name].   HISTORY OF PRESENT ILLNESS: The patient is a 74 year old Caucasian male who resides at [facility] nursing [facility]. Was found today lying on the floor secondary to unwitnessed fall. The patient also has not been eating well or drinking well for the past 2 or 3 days. In the ER, the patient is orthostatic with a blood pressure of 82/59 and on standing up, the blood pressure dropped down to 62/49. The patient seems extremely dry. The patient's BUN and creatinine are elevated. The blood pressure being low, the patient was given IV fluid bolus in the ER. No family members are at bedside. The patient is completely demented and nonverbal.   PAST MEDICAL HISTORY: GERD, dysphagia and dementia.   PAST SURGICAL HISTORY: None.   ALLERGIES: SULFA.   HOME MEDICATIONS: Vitamin D3 1000 units p.o. b.i.d., risperidone 0.5 mg 1 tablet 2 times a day, Remeron 15 mg 1 tablet once a day, omeprazole 40 mg once daily, multivitamin 1 tablet p.o. once daily, mirtazapine 15 mg once daily, metoprolol 25 mg 1/2 tablet p.o. b.i.d., ketoconazole topical 2% applied to the affected area, hydroxyzine 1 tablet p.o. once daily, Ensure Plus 227 mL p.o. q.8 hours.   PSYCHOSOCIAL HISTORY: The patient does not smoke, does not drink or take illicit drugs as per the previous reports. The patient is currently residing at [facility] assisted living facility. The patient is chronically demented.   REVIEW OF SYSTEMS: Unobtainable from the patient as the patient is demented.   PHYSICAL EXAMINATION:  VITAL SIGNS: Temp is 98.4. Pulse was eventually 89 after fluid bolus. Respirations 18. Blood pressure is 67/54, 90/80, then 79/62. Pulse ox 94% on room air.  GENERAL APPEARANCE: Not in any acute distress, moderately built and moderately nourished.  HEENT: Normocephalic. Pupils are equal, reacting to  light and accommodation. No scleral icterus. Extraocular movements are intact. No sinus tenderness. Extremely dry mucous membranes. No pharyngeal exudates. No postnasal drip.  NECK: Supple. No JVD. No thyromegaly. No lymphadenopathy.   LUNGS: Clear to auscultation bilaterally. Good air entry. No wheezing. No crackles. No accessory muscle usage. No anterior chest wall tenderness on palpation.  CARDIAC: S1, S2 normal. Regular  rate and rhythm. No murmurs.  GASTROINTESTINAL: Soft. Bowel sounds are positive in all 4 quadrants. Nontender, nondistended. No masses felt.  NEUROLOGIC: Awake and alert but disoriented, with extreme global dementia, not following verbal commands. Reflexes are 2+. Motor and sensory are grossly intact. I could not examine cranial nerves as the patient is with altered mentation.  EXTREMITIES: No edema. No cyanosis. No clubbing.  MUSCULOSKELETAL: No joint effusion, tenderness or erythema.  PSYCHIATRIC: Mood and affect cannot be determined as the patient is extremely demented.   LABS AND IMAGING STUDIES: Glucose 114, BUN 43, creatinine 1.93, sodium 156, potassium 4.0. Chloride level is 122. Serum CO2 is 29. GFR is 34. Anion gap is 5. Serum osmolality 322. Calcium is 72.  LFTs are within normal range except albumin which is low at 3.2. The patient's troponin was elevated at 0.76 and 0.61 on March 3rd and 4th. Urinalysis amber color, hazy in appearance. Glucose, bili and ketones are negative. Specific gravity 1.033. Blood negative, nitrite negative, leukocyte esterase negative. CHEST X-RAY, PORTABLE:  Cardiomegaly and some right lung base atelectasis or fibrosis. Followup with PA and lateral views is recommended on as needed basis. CT HEAD: No  acute findings as per the ER physician. CHEST X-RAY: No acute findings.   ASSESSMENT AND PLAN: A 74 year old male who is residing at [facility] with severe dementia and presenting to the ER after he sustained a fall. Initial CT head is negative. Will  admit the patient for further workup.  1. Syncope: Probably from dehydration. The patient is orthostatic. CT head is negative. The patient was given intravenous fluid boluses in the ER. We will continue intravenous fluids with D5 half-normal with 20 K. Repeat orthostatics. The patient will be monitored on telemetry. Will cycle cardiac biomarkers.  2. As the patient is orthostatic, syncope was presumed to be from orthostatic hypotension. Will monitor the patient on telemetry and cycle cardiac biomarkers. Gentle hydration with intravenous fluids and recheck orthostatics in a.m.  3. Hypernatremia: Probably from severe dehydration. We are providing him with fluid boluses and then will continue fluids at D5 half-normal with 20 K at 100 mL an hour.  4. Acute renal failure: Probably from severe dehydration with dry mucous membranes.  5. Acute kidney injury from severe dehydration. All cell lines are concentrated. Hemoglobin and hematocrit are also concentrated. Avoid nephrotoxins.  6. History of gastroesophageal reflux disease. Will provide Protonix.  7. Dysphagia which is chronic in nature. Will continue close monitoring and will provide the patient with intravenous Protonix.  8. The patient will be provided with gastrointestinal and deep vein thrombosis prophylaxis.   He is FULL CODE.   The diagnosis and plan of care was discussed with the patient.   TOTAL TIME SPENT ON ADMISSION: 50 minutes.    ____________________________ Ramonita LabAruna Jahkari Maclin, MD ag:gb D: 07/18/2012 03:22:48 ET T: 07/18/2012 03:53:25 ET JOB#: [id number]  cc: Ramonita LabAruna Silveria Botz, MD, <Dictator> Ramonita LabARUNA Levelle Edelen MD ELECTRONICALLY SIGNED 07/26/2012 22:49

## 2014-07-27 NOTE — H&P (Signed)
PATIENT NAME:  Nicholas Watts, Nicholas Watts MR#:  [id number] DATE OF BIRTH:  [birth year]  DATE OF ADMISSION:  06/06/2012  REASON FOR ADMISSION:  "Less interactive, not able to sit down, being very weak and lethargic."   PRIMARY CARE PHYSICIAN:  Einar Grad, MD  REFERRING PHYSICIAN:  Randon Goldsmith. Lord, MD  HISTORY OF PRESENT ILLNESS:  The patient is a nice 74 year old old demented patient who has history of frontal lobe dementia, very severe, and gastroesophageal reflux. He has been institutionalized for the last 2 years at an assisted living facility, mostly skilled nursing type of care, and the patient has been doing okay overall up until Saturday. The family is here with the patient and they are able to give me information. Apparently, they saw the patient on Friday and he was doing fine. He was his normal self, but on Saturday, he had different visitors and they stated that was being lethargic, uncomfortable, less interactive, not able to sit down and overall looking sick. The family went to see him today and they determined that he really did not look all right for which they brought him to the ER. At the ER, his oxygen saturation was borderline low around 92%. His respiratory rate was up to 28 and his heart rate was 136. His temperature was low at 96. He had an x-ray that showed a forming right lower lobe infiltrate. May be band atelectasis, fibrosis or possible infiltrate of the right lung is what was described. The patient had elevation of his white blood count for which he is admitted for treatment of healthcare-acquired pneumonia.   REVIEW OF SYSTEMS:  Unable to obtain given the patient's severe dementia. As per family, he has not had any fever. He has not been coughing significantly and overall he was just less interactive and lethargic.   PAST MEDICAL HISTORY:   1.  Severe frontal lobe dementia.  2.  Gastroesophageal reflux.   ALLERGIES:  THE PATIENT IS ALLERGIC TO SULFA THAT CAUSES HIM TO HAVE A RASH.    SOCIAL HISTORY:  The patient does not smoke, does not drink and never has. He used to work for the [employer] for over 42 years. He currently resides at the [facility] assisted living facility.   FAMILY HISTORY:  Positive for lung cancer in his mother and vascular disease and cardiovascular disease in his father.   PAST SURGICAL HISTORY:  The family denies any surgical history.   CURRENT MEDICATIONS:  Prilosec 40 mg once daily, risperidone 0.5 mg twice daily, vitamin D 1000 units twice daily, Ensure 3 times daily, hydroxyzine 25 mg at bedtime, Remeron 15 mg at bedtime, multivitamins once a day and ketoconazole cream on affected areas.   PHYSICAL EXAMINATION: VITAL SIGNS: Blood pressure of 126/71, pulse 136, respirations 28, temperature 96, oxygen saturation around 92% on room air at admission.  GENERAL: The patient is awake. He is nonverbal. He laughs and looks at his family, but that is the most interaction I can get out of him. He is not toxic looking. He is hemodynamically stable.  HEENT: His pupils are equal and reactive. Extraocular movements are intact. Mucosa is very dry. Anicteric sclerae. Pink conjunctivae. No oral lesions. No thrush.  NECK: Supple. No JVD. No thyromegaly. No adenopathy. No carotid bruits. No rigidity.  CARDIOVASCULAR: Regular rate and rhythm. No murmurs, rubs, or gallops. The patient is tachycardic. No displacement of PMI. No tenderness to palpation of anterior chest wall.  LUNGS: Positive rales in right base. The rest of the exam  is clear. No use of accessory muscles. No dullness to percussion.  ABDOMEN: Soft, nontender, nondistended. No hepatosplenomegaly. No masses. Bowel sounds are positive.  GENITAL: The patient has a Foley catheter which is in place. No external lesions.  EXTREMITIES: No edema, no cyanosis, no clubbing. Pulses +2. Capillary refill about 4 seconds.  SKIN: Without any rashes or petechiae. No ulcers.  NEUROLOGIC: Cranial nerves grossly intact. The  patient is demented and not interactive verbally.  PSYCHIATRIC: The patient is not agitated. He seems calm right now.  LYMPHATIC: Negative for lymphadenopathy in neck or supraclavicular areas.  MUSCULOSKELETAL: No significant joint effusions or joint lesions.   DIAGNOSTIC DATA:  Creatinine is 1.54 which has increased from baseline of around 1.3, glucose is 139, BUN 35, albumin 2.9. AST is 45. Total CK is 7.41. CK-MB is 4.9. Troponin is 0.87. White count is 21,000, hemoglobin 16, platelets are 264. Urinalysis shows no signs of urinary infection. Urine seems concentrated and turbid. We are going to send for culture. EKG shows new T-wave inversions on lower leads, but no ST depression. Possible inversions as well on V4 to V6 compared with previous EKG.   ASSESSMENT AND PLAN:  A 74 year old gentleman with dementia admitted for treatment of systemic inflammatory response syndrome, pneumonia and possible non-ST elevation myocardial infarction.  1.  Systemic inflammatory response syndrome. The patient has tachycardia, tachypnea, increased white blood cells, decreased temperature and changes in his mental status. The patient looks as well dehydrated. Likely the source is pulmonary as the patient has a forming infiltrate on the right lower lobe. We are going to treat him for healthcare-acquired pneumonia with Levaquin, Zosyn and vancomycin. I will say stop vancomycin if the patient is not febrile and his white count is coming down without problems, but for now we are going to do vanc for mrsa coverage.. The patient is going to have respiratory toilet and intravenous fluids at a slow rate as his blood pressure is okay.  2.  Pneumonia. The patient has healthcare-acquired pneumonia with treatment with antibiotics mentioned above and pulmonary toilet. At this moment, he is hemodynamically stable.  3.  Non-ST elevation myocardial infarction. The patient has elevated cardiac enzymes, MB-CK and troponin. He does not have  any history of coronary artery disease. His last troponin checked here in the hospital was normal for which it is not necessarily a leak. It could be demand ischemia related to the infection and the tachycardia, but for now what we are going to do is treat it as a non-ST elevation myocardial infarction. Lovenox 1 mg/kg q. 12 hours, statin, beta-blocker and aspirin have been added. The patient is not complaining of any pain. Although he is demented, we do not know. We are going to add on nitro paste and make sure that he does not have low blood pressure.  4.  Dementia. Continue treatment with mirtazapine (Remeron).  5.  Diet. The patient has not concernrs as far as aspiration, but he is on a special diet. We are going to continue pureed diet on the patient with Ensure.  6.  Gastrointestinal prophylaxis with proton pump inhibitor.  7.  Increased creatinine. At this moment, he does not meet criteria for acute kidney injury, but he is definitely azotemic with increased creatinine and BUN. We are going to give him intravenous fluids and follow up on his creatinine.  8.  Other medical problems are stable.  9.  The patient is a full code. This was discussed with the family.  TIME SPENT:  I spent about 45 minutes with this patient.    ____________________________ Felipa Furnaceoberto Sanchez Gutierrez, MD rsg:si D: 06/06/2012 18:36:27 ET T: 06/06/2012 21:39:03 ET JOB#: [id number]  cc: Felipa Furnaceoberto Sanchez Gutierrez, MD, <Dictator> [name], MD Pearletha FurlOBERTO SANCHEZ GUTIERRE MD ELECTRONICALLY SIGNED 06/21/2012 13:02

## 2014-07-27 NOTE — Consult Note (Signed)
PATIENT NAME:  Nicholas Watts, Author MR#:  [id number] DATE OF BIRTH:  10/10/1940  NEPHROLOGY CONSULTATION REPORT  DATE OF CONSULTATION:  07/18/2012  REQUESTING PHYSICIAN:  Dr. Ramonita LabAruna Gouru.  CONSULTING PHYSICIAN:  Blaire Palomino Lizabeth LeydenN. Hudsen Fei, MD  REASON FOR CONSULTATION:   Hypernatremia; acute renal failure.  HISTORY OF PRESENT ILLNESS: The patient is a 74 year old Caucasian male with a past medical history of frontal lobe dementia, GERD, dysphagia, recent pneumonia, recent acute renal failure who presented to [facility] yesterday, status post unwitnessed fall. The patient unfortunately cannot offer any history as he is nonverbal. As per medical records and dictated in chart review, it appears that the patient had not been eating or drinking very well for 2 to 3 days prior to admission. He was found to be significantly hypotensive in the Emergency Room with a systolic blood pressure in the 80s. The patient was found to have significantly dry oral mucosa. We are now consulted for the evaluation and  management of acute renal failure as well as hypernatremia. The patient's serum sodium was 156 upon presentation, and is now 158. He is receiving D5W at present. He also appears to have acute renal failure. His baseline creatinine is 1.2 from 06/08/2012. Creatinine is now up to 1.89. Nursing reports that he did drink some fluid. No swallow evaluation is currently pending.   PAST MEDICAL HISTORY: 1.  Frontal lobe dementia.  2.  Gastroesophageal reflux disease.  3.  Dysphagia.  4.  History of recent pneumonia.  5.  Recent acute renal failure.   PAST SURGICAL HISTORY: None.   ALLERGIES: TO SULFA.   CURRENT INPATIENT MEDICATIONS: Include heparin 5000 units subQ every 8 hours, Zofran 4 mg IV q. 6 hours p.r.n., pantoprazole 40 mg p.o. q.a.m., D5W at 100 mL per hour.   SOCIAL HISTORY: Unable to obtain directly from the patient, however he is a resident at [facility]. He is chronically demented. No  reported tobacco, alcohol, or illicit drug use.   FAMILY HISTORY: Unable to obtain from the patient as he has chronic dementia.   REVIEW OF SYSTEMS: Unable to obtain from the patient as he is nonverbal and chronically demented.   PHYSICAL EXAMINATION: VITAL SIGNS: Temperature 97.6, pulse 78, respirations 20, blood pressure 110/67, pulse oximetry 97%.  GENERAL: Reveals a slender Caucasian male who appears reported age, currently in no acute distress.  HEENT: Normocephalic, atraumatic. Extraocular movements were spontaneously noted. Difficult to assess his hearing. Oral mucosa was quite dry.  NECK: Supple, without JVD or lymphadenopathy.  LUNGS: Clear to auscultation bilaterally, with normal respiratory effort.  CARDIOVASCULAR: S1, S2, regular rate and rhythm. No murmurs, rubs, or gallops appreciated.  ABDOMEN: Soft, nontender, nondistended. Bowel sounds positive. No rebound or guarding. No gross organomegaly appreciated.  EXTREMITIES: No clubbing, cyanosis, or edema.  NEUROLOGIC: The patient is awake, but he is not following commands. He is spontaneously moving his upper and lower extremities.  SKIN: Warm and dry. No rashes noted.  MUSCULOSKELETAL: No joint redness, swelling or tenderness appreciated.  GENITOURINARY: No suprapubic tenderness is noted at this time.  PSYCHIATRIC: Unable to assess at this time.   LABORATORY DATA: Sodium 158, potassium 3.8, chloride 126, CO2 23, BUN 41, creatinine 1.8, glucose 105, CK of 18.   Urinalysis is negative for protein, 6 RBCs per high-power field and 1 WBC per high-power field.   CBC showed a WBC of 12.4, hemoglobin 16.3, hematocrit 49, platelets 239.   Chest x-ray shows minimal left basilar opacities.   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   IMPRESSION: This is a 74 year old Caucasian male with a past medical history of frontal lobe dementia, dysphagia, gastroesophageal reflux disease, recent  pneumonia, recent acute renal failure who presented to [facility] status post fall and found to have acute renal failure, hypernatremia.   PROBLEM LIST: 1.  Severe hypernatremia, with serum sodium of 158.  2.  Acute renal failure.  3.  Chronic dementia.  4.  Hypotension, improved.   PLAN: More than likely the patient's hypernatremia can be attributed to poor p.o. liquid intake over the past several days prior to admission. His acute renal failure may represent acute tubular necrosis, particularly given the fact that he was hypotensive. I agree with administering the patient D5W. We will increase D5W to 150 mL per hour. In regards to his acute renal failure, we will obtain a renal ultrasound to exclude obstruction as a cause of his renal failure. As above, we will continue hydration for the acute renal failure as well. The patient's blood pressure now is currently improved at 110/67. Would certainly continue to monitor a blood pressure curve over the course of this hospitalization.   Further plan to be based upon patient's progress over the course of the hospitalization.   I would like to thank Dr. Amado Coe for this kind referral.     ____________________________ Lennox Pippins, MD mnl:dm D: 07/18/2012 12:25:56 ET T: 07/18/2012 13:25:35 ET JOB#: [id number]  cc: Lennox Pippins, MD, <Dictator> Ria Comment Luca Burston MD ELECTRONICALLY SIGNED 08/05/2012 20:57

## 2014-07-27 NOTE — Consult Note (Signed)
   Comments   Pt apparently has 2 ex-wives with a daughter by one marriage and a son and daughter by the second marriage. Relationship between pt's children appears strained. I spoke at length with daughter, Cleophas DunkerCindy Forehand. Spoke to son, Oneita JollyRonnie Bebee, earlier today. There is a document in SCM which designates son Christen BameRonnie as HCPOA (dated 09/05/2009).There is also a document that designates son Christen BameRonnie as pt's guardian (dated 09/19/10). Daughter, Cleophas DunkerCindy Forehand, unaware of these documents. Plan to try to speak with pt's children tomorrow to help with difficult situation and try to have family focus on best medical decisions for pt.   Electronic Signatures: Marques Ericson, Harriett SineNancy (MD)  (Signed 15-Apr-14 16:01)  Authored: Palliative Care   Last Updated: 15-Apr-14 16:01 by Aide Wojnar, Harriett SineNancy (MD)

## 2014-07-29 NOTE — H&P (Signed)
PATIENT NAME:  Nicholas Watts, Nicholas Watts MR#:  [id number] DATE OF BIRTH:  Sep 26, 1940  DATE OF ADMISSION:  10/15/2011  PRIMARY CARE PHYSICIAN: Nicholas Watts    CHIEF COMPLAINT: Diarrhea and vomiting and altered mental status.   HISTORY OF PRESENT ILLNESS: This is a 74 year old male who currently resides at [facility] assisted living presented to the Emergency Room brought in by his daughter due to vomiting and diarrhea. As per the daughter who gives most of the history as patient has underlying dementia, patient was in her usual state of health yesterday when she saw him and was feeling fine. Today when she went to see the patient, patient was noted to be covered in vomit and also noted to have diarrhea. He has had no further episodes of nausea or vomiting or any further diarrhea since he has been in the Emergency Room. Patient was noted to be slightly hypoxic with oxygen saturation in the mid 80s and also noted to be slightly hypertensive when he came to the ER. There is some suspicion that patient could have possibly had an aspiration event after vomiting and therefore hospitalist service was contacted for further treatment and evaluation. Patient currently denies any other systemic complaints presently.   REVIEW OF SYSTEMS: Otherwise unobtainable given the patient's severe frontal lobe dementia.   PAST MEDICAL HISTORY:  1. Gastroesophageal reflux disease. 2. Severe frontal lobe dementia.   ALLERGIES: Sulfa drugs which causes a rash.   SOCIAL HISTORY: No smoking. No alcohol abuse. No illicit drug abuse. Currently resides at [facility] assisted living.   FAMILY HISTORY: Patient's mother died from lung cancer. Father died from old age but had severe vascular disease.   CURRENT MEDICATIONS:  1. Hydroxyzine 25 mg at bedtime.  2. Multivitamin daily.  3. Omeprazole 40 mg daily. 4. Risperidone 0.5 mg 1 tab b.i.d.  5. Vitamin B complex 1 tab daily.  6. Vitamin D3 2000 international units daily.    PHYSICAL EXAMINATION:  VITAL SIGNS: Patient's vital signs are noted to be: Temperature 100.6, pulse 105, respirations 22, blood pressure 160/85, sats 95% on 2 liters nasal cannula.   GENERAL: He is a pleasant but anxious-appearing male who is slightly shivering but in no apparent distress.   HEENT: Atraumatic, normocephalic. Extraocular muscles are intact. Pupils equal, reactive to light. Sclerae anicteric. No conjunctival injection. No oropharyngeal erythema.   NECK: Supple. There is no jugular venous distention, no bruits, no lymphadenopathy, no thyromegaly.   HEART: Regular rate and rhythm. No murmurs, no rubs, no clicks.   LUNGS: Clear to auscultation bilaterally. No rales, no rhonchi, no wheezes.   ABDOMEN: Soft, flat, nontender, nondistended. Has good bowel sounds. No hepatosplenomegaly appreciated.   EXTREMITIES: No evidence of any cyanosis, clubbing, or peripheral edema. Has +2 pedal and radial pulses bilaterally.   NEUROLOGIC: He is alert, awake, oriented x1, has a resting slight tremor, otherwise difficult to do a full neurological exam. Patient is globally weak but moves all extremities spontaneously.   SKIN: Moist, warm with no rashes.   LYMPHATIC: There is no cervical or axillary lymphadenopathy.   LABORATORY, DIAGNOSTIC AND RADIOLOGICAL DATA: Serum glucose 139, BUN 8, creatinine 1.3, sodium 142, potassium 4.1, chloride 103, bicarbonate 27. LFTs are within normal limits. White cell count 15.2, hemoglobin 14.3, hematocrit 43.8, platelet count 261. Urinalysis is within normal limits.   Patient did have a chest x-ray done which showed no acute cardiopulmonary disease.   ASSESSMENT AND PLAN: This is a 74 year old male with past medical history of  frontal lobe dementia, gastroesophageal reflux disease presents to the hospital due to vomiting and diarrhea, also noted to be hypoxic in the Emergency Room and more lethargic, confused than normal.  1. Suspected pneumonia, likely  cause of patient's cough and hypoxia. This may have happened after his vomiting event earlier today although his chest x-ray does not show any acute findings. I will empirically start him on IV Zosyn. Follow sputum and blood cultures. Place him on aspiration precautions. I will get a speech evaluation in the morning.  2. Dementia. I will continue his risperidone for now.  3. Gastroesophageal reflux disease. Continue his Prilosec.  4. Leukocytosis. I think this is suspected likely due to pneumonia. Will follow his white cell count in the morning after treatment with antibiotics.  5. Acute renal failure. Will gently hydrate him with IV fluids and follow his BUN and creatinine.  6. CODE STATUS: Patient is a FULL CODE.   TIME SPENT: 50 minutes.  ____________________________ Nicholas PancakeVivek J. Cherlynn KaiserSainani, MD vjs:cms D: 10/15/2011 19:43:15 ET T: 10/16/2011 06:25:40 ET JOB#: [id number]  cc: Nicholas PancakeVivek J. Cherlynn KaiserSainani, MD, <Dictator> Dr. Einar Gradobert Watts  Nicholas SirenVIVEK J Nicholas Carillo MD ELECTRONICALLY SIGNED 10/16/2011 15:47

## 2014-07-29 NOTE — Discharge Summary (Signed)
PATIENT NAME:  Nicholas Watts, Nicholas Watts MR#:  [id number] DATE OF BIRTH:  1940-09-02  DATE OF ADMISSION:  10/15/2011 DATE OF DISCHARGE:  10/17/2011   ADMISSION DIAGNOSIS: Aspiration pneumonia.   DISCHARGE DIAGNOSES:  1. Aspiration pneumonia.  2. SIRS secondary to diarrhea, vomiting, likely viral in etiology.  3. Hypoxia.  4. Acute renal failure.  5. Dementia.   CONSULTS: None.   PERTINENT LABORATORY: Sodium 142, potassium 3.8, chloride 107, bicarb 25, BUN 8, creatinine 1.42, glucose 96, white blood cells 10.   HOSPITAL COURSE: This is a 74 year old male with dementia who presented with nausea and vomiting and found to have SIRS and hypoxia. For further details, please refer to the history and physical.  1. SIRS from diarrhea and vomiting possibly viral in nature, resolved shortly after hospitalization. We were unable to obtain any cultures because he did not have any diarrhea during his hospitalization.  2. Acute renal failure, likely from dehydration.  3. Hypoxia secondary to aspiration pneumonia. Chest x-ray was not consistent with aspiration, however, clinically the patient did have aspiration pneumonia. The patient was seen by speech pathologist who recommended a thickened nectar diet, aspiration precautions.  4. Dementia. The patient will continue on his outpatient medications including Risperdal.   DISCHARGE MEDICATIONS:  1. Clindamycin 300 mg p.o. q.8 hours for 10 days.  2. Hydroxyzine hydrochloride 25 mg daily.  3. Vitamin D3 1000 international units 2 tablets daily.  4. Multivitamin 1 tablet daily.  5. Risperdal 0.5 mg 1 tablet b.i.d.  6. Omeprazole 40 mg daily.   DISCHARGE DIET: Dysphasia I nectar thick liquids, aspiration precautions.   DISCHARGE REFERRAL: Home health care nurse and speech.   DISCHARGE FOLLOW-UP: Follow-up in 1 to 2 weeks with [name].    TIME SPENT: 35 minutes.   ____________________________ Janyth ContesSital P. Juliene PinaMody, MD spm:drc D: 10/17/2011 11:47:55  ET T: 10/17/2011 11:57:11 ET JOB#: [id number]  cc: Sahand Gosch P. Juliene PinaMody, MD, <Dictator> [name] [name] MD ELECTRONICALLY SIGNED 10/17/2011 12:08
